# Patient Record
Sex: Female | Born: 1950
Health system: Southern US, Community
[De-identification: ages and names within clinical notes are randomized; demographics above are authoritative.]

## PROBLEM LIST (undated history)

## (undated) DIAGNOSIS — F419 Anxiety disorder, unspecified: Secondary | ICD-10-CM

## (undated) DIAGNOSIS — E079 Disorder of thyroid, unspecified: Secondary | ICD-10-CM

## (undated) HISTORY — PX: AUGMENTATION MAMMAPLASTY: SUR837

## (undated) HISTORY — DX: Disorder of thyroid, unspecified: E07.9

## (undated) HISTORY — DX: Anxiety disorder, unspecified: F41.9

---

## 1988-05-11 HISTORY — PX: UMBILICAL HERNIA REPAIR: SHX196

## 1997-05-11 HISTORY — PX: FOREARM FRACTURE SURGERY: SHX649

## 2000-09-14 ENCOUNTER — Other Ambulatory Visit: Admission: RE | Admit: 2000-09-14 | Discharge: 2000-09-14 | Payer: Self-pay | Admitting: Obstetrics and Gynecology

## 2001-05-11 HISTORY — PX: BREAST ENHANCEMENT SURGERY: SHX7

## 2001-11-24 ENCOUNTER — Other Ambulatory Visit: Admission: RE | Admit: 2001-11-24 | Discharge: 2001-11-24 | Payer: Self-pay | Admitting: Obstetrics and Gynecology

## 2001-11-30 ENCOUNTER — Encounter: Payer: Self-pay | Admitting: Obstetrics and Gynecology

## 2001-11-30 ENCOUNTER — Encounter: Admission: RE | Admit: 2001-11-30 | Discharge: 2001-11-30 | Payer: Self-pay | Admitting: Obstetrics and Gynecology

## 2002-12-22 ENCOUNTER — Other Ambulatory Visit: Admission: RE | Admit: 2002-12-22 | Discharge: 2002-12-22 | Payer: Self-pay | Admitting: Obstetrics and Gynecology

## 2004-01-30 ENCOUNTER — Other Ambulatory Visit: Admission: RE | Admit: 2004-01-30 | Discharge: 2004-01-30 | Payer: Self-pay | Admitting: Obstetrics and Gynecology

## 2004-06-11 ENCOUNTER — Encounter: Admission: RE | Admit: 2004-06-11 | Discharge: 2004-06-11 | Payer: Self-pay | Admitting: Obstetrics and Gynecology

## 2005-02-19 ENCOUNTER — Other Ambulatory Visit: Admission: RE | Admit: 2005-02-19 | Discharge: 2005-02-19 | Payer: Self-pay | Admitting: Obstetrics and Gynecology

## 2005-03-02 ENCOUNTER — Encounter: Admission: RE | Admit: 2005-03-02 | Discharge: 2005-03-02 | Payer: Self-pay | Admitting: Obstetrics and Gynecology

## 2005-11-13 ENCOUNTER — Encounter: Admission: RE | Admit: 2005-11-13 | Discharge: 2005-11-13 | Payer: Self-pay | Admitting: Obstetrics and Gynecology

## 2007-05-31 ENCOUNTER — Encounter: Admission: RE | Admit: 2007-05-31 | Discharge: 2007-05-31 | Payer: Self-pay | Admitting: Obstetrics and Gynecology

## 2008-08-02 ENCOUNTER — Encounter: Admission: RE | Admit: 2008-08-02 | Discharge: 2008-08-02 | Payer: Self-pay | Admitting: Obstetrics and Gynecology

## 2010-03-20 ENCOUNTER — Ambulatory Visit: Payer: Self-pay | Admitting: Sports Medicine

## 2010-03-20 DIAGNOSIS — IMO0002 Reserved for concepts with insufficient information to code with codable children: Secondary | ICD-10-CM | POA: Insufficient documentation
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
## 2010-07-23 ENCOUNTER — Encounter: Payer: Self-pay | Admitting: *Deleted

## 2012-02-08 ENCOUNTER — Encounter: Payer: Self-pay | Admitting: Internal Medicine

## 2012-03-14 ENCOUNTER — Ambulatory Visit (AMBULATORY_SURGERY_CENTER): Payer: 59 | Admitting: *Deleted

## 2012-03-14 VITALS — Ht 63.0 in | Wt 140.0 lb

## 2012-03-14 DIAGNOSIS — Z1211 Encounter for screening for malignant neoplasm of colon: Secondary | ICD-10-CM

## 2012-03-14 MED ORDER — NA SULFATE-K SULFATE-MG SULF 17.5-3.13-1.6 GM/177ML PO SOLN: ORAL | Status: DC

## 2012-03-28 ENCOUNTER — Encounter: Payer: Self-pay | Admitting: Internal Medicine

## 2012-03-28 ENCOUNTER — Ambulatory Visit (AMBULATORY_SURGERY_CENTER): Payer: 59 | Admitting: Internal Medicine

## 2012-03-28 VITALS — BP 107/68 | HR 62 | Temp 99.5°F | Resp 23 | Ht 63.0 in | Wt 140.0 lb

## 2012-03-28 DIAGNOSIS — Z1211 Encounter for screening for malignant neoplasm of colon: Secondary | ICD-10-CM

## 2012-03-28 MED ORDER — SODIUM CHLORIDE 0.9 % IV SOLN: 500.0000 mL | INTRAVENOUS | Status: DC

## 2012-03-28 NOTE — Patient Instructions (Addendum)
Normal colon exam today!! Repeat colonoscopy in 10 years. Resume current medications! Call us with any questions or concerns. Thank you!!  YOU HAD AN ENDOSCOPIC PROCEDURE TODAY AT THE [facility]: Refer to the procedure report that was given to you for any specific questions about what was found during the examination.  If the procedure report does not answer your questions, please call your gastroenterologist to clarify.  If you requested that your care partner not be given the details of your procedure findings, then the procedure report has been included in a sealed envelope for you to review at your convenience later.  YOU SHOULD EXPECT: Some feelings of bloating in the abdomen. Passage of more gas than usual.  Walking can help get rid of the air that was put into your GI tract during the procedure and reduce the bloating. If you had a lower endoscopy (such as a colonoscopy or flexible sigmoidoscopy) you may notice spotting of blood in your stool or on the toilet paper. If you underwent a bowel prep for your procedure, then you may not have a normal bowel movement for a few days.  DIET: Your first meal following the procedure should be a light meal and then it is ok to progress to your normal diet.  A half-sandwich or bowl of soup is an example of a good first meal.  Heavy or fried foods are harder to digest and may make you feel nauseous or bloated.  Likewise meals heavy in dairy and vegetables can cause extra gas to form and this can also increase the bloating.  Drink plenty of fluids but you should avoid alcoholic beverages for 24 hours.  ACTIVITY: Your care partner should take you home directly after the procedure.  You should plan to take it easy, moving slowly for the rest of the day.  You can resume normal activity the day after the procedure however you should NOT DRIVE or use heavy machinery for 24 hours (because of the sedation medicines used during the test).    SYMPTOMS TO  REPORT IMMEDIATELY: A gastroenterologist can be reached at any hour.  During normal business hours, 8:30 AM to 5:00 PM Monday through Friday, call [phone].  After hours and on weekends, please call the GI answering service at [phone] who will take a message and have the physician on call contact you.   Following lower endoscopy (colonoscopy or flexible sigmoidoscopy):  Excessive amounts of blood in the stool  Significant tenderness or worsening of abdominal pains  Swelling of the abdomen that is new, acute  Fever of 100F or higher  FOLLOW UP: If any biopsies were taken you will be contacted by phone or by letter within the next 1-3 weeks.  Call your gastroenterologist if you have not heard about the biopsies in 3 weeks.  Our staff will call the home number listed on your records the next business day following your procedure to check on you and address any questions or concerns that you may have at that time regarding the information given to you following your procedure. This is a courtesy call and so if there is no answer at the home number and we have not heard from you through the emergency physician on call, we will assume that you have returned to your regular daily activities without incident.  SIGNATURES/CONFIDENTIALITY: You and/or your care partner have signed paperwork which will be entered into your electronic medical record.  These signatures attest to the fact that that   the information above on your After Visit Summary has been reviewed and is understood.  Full responsibility of the confidentiality of this discharge information lies with you and/or your care-partner.  

## 2012-03-28 NOTE — Progress Notes (Signed)
Patient did not experience any of the following events: a burn prior to discharge; a fall within the facility; wrong site/side/patient/procedure/implant event; or a hospital transfer or hospital admission upon discharge from the facility. (G8907) Patient did not have preoperative order for IV antibiotic SSI prophylaxis. (G8918)  

## 2012-03-28 NOTE — Progress Notes (Signed)
Pt stable, drowsy, vss, report to [place], Charity fundraiser

## 2012-03-28 NOTE — Op Note (Signed)
 [facility] [address] [place], [address]   COLONOSCOPY PROCEDURE REPORT  PATIENT: Renee, Montgomery  MR#: [id number] BIRTHDATE: [birth year] , 61  yrs. old GENDER: Female ENDOSCOPIST: Iva Boop, MD, [facility] REFERRED GM:WNUUV Docia Chuck, M.D. PROCEDURE DATE:  03/28/2012 PROCEDURE:   Colonoscopy, diagnostic ASA CLASS:   Class II INDICATIONS:average risk screening. MEDICATIONS: propofol (Diprivan) 300mg  IV, MAC sedation, administered by CRNA, and These medications were titrated to patient response per physician's verbal order  DESCRIPTION OF PROCEDURE:   After the risks benefits and alternatives of the procedure were thoroughly explained, informed consent was obtained.  A digital rectal exam revealed no abnormalities of the rectum.   The LB [id number]-[id number]  endoscope was introduced through the anus and advanced to the cecum, which was identified by both the appendix and ileocecal valve. No adverse events experienced.   The quality of the prep was Suprep excellent The instrument was then slowly withdrawn as the colon was fully examined.      COLON FINDINGS: A normal appearing cecum, ileocecal valve, and appendiceal orifice were identified.  The ascending, hepatic flexure, transverse, splenic flexure, descending, sigmoid colon and rectum appeared unremarkable.  No polyps or cancers were seen. Retroflexed views revealed no abnormalities. The time to cecum=7 minutes 53 seconds.  Withdrawal time=9 minutes 35 seconds.  The scope was withdrawn and the procedure completed. COMPLICATIONS: There were no complications.  ENDOSCOPIC IMPRESSION: Normal colon  RECOMMENDATIONS: Repeat Colonscopy in 10 years.   eSigned:  Iva Boop, MD, [facility] 03/28/2012 10:22 AM   cc: The Patient  and Dibias Docia Chuck, MD

## 2012-03-29 ENCOUNTER — Telehealth: Payer: Self-pay | Admitting: *Deleted

## 2012-03-29 NOTE — Telephone Encounter (Signed)
  Follow up Call-  Call back number 03/28/2012  Post procedure Call Back phone  # [phone]  office  Permission to leave phone message Yes     Patient questions:  Unable to leave a message on VM as pt doesn't have one.  Uncomfortable leaving personal message with staff.

## 2012-10-04 ENCOUNTER — Other Ambulatory Visit: Payer: Self-pay | Admitting: Obstetrics and Gynecology

## 2012-10-04 DIAGNOSIS — R928 Other abnormal and inconclusive findings on diagnostic imaging of breast: Secondary | ICD-10-CM

## 2012-10-13 ENCOUNTER — Ambulatory Visit
Admission: RE | Admit: 2012-10-13 | Discharge: 2012-10-13 | Disposition: A | Discharge status: Home / Self Care | Payer: 59 | Source: Ambulatory Visit | Attending: Obstetrics and Gynecology | Admitting: Obstetrics and Gynecology

## 2012-10-13 DIAGNOSIS — R928 Other abnormal and inconclusive findings on diagnostic imaging of breast: Secondary | ICD-10-CM

## 2014-11-01 ENCOUNTER — Other Ambulatory Visit: Payer: Self-pay | Admitting: Obstetrics and Gynecology

## 2014-11-01 DIAGNOSIS — R928 Other abnormal and inconclusive findings on diagnostic imaging of breast: Secondary | ICD-10-CM

## 2014-11-05 ENCOUNTER — Ambulatory Visit
Admission: RE | Admit: 2014-11-05 | Discharge: 2014-11-05 | Disposition: A | Discharge status: Home / Self Care | Payer: 59 | Source: Ambulatory Visit | Attending: Obstetrics and Gynecology | Admitting: Obstetrics and Gynecology

## 2014-11-05 DIAGNOSIS — R928 Other abnormal and inconclusive findings on diagnostic imaging of breast: Secondary | ICD-10-CM

## 2015-04-17 ENCOUNTER — Other Ambulatory Visit: Payer: Self-pay | Admitting: Obstetrics and Gynecology

## 2015-04-17 DIAGNOSIS — R921 Mammographic calcification found on diagnostic imaging of breast: Secondary | ICD-10-CM

## 2015-05-16 ENCOUNTER — Ambulatory Visit
Admission: RE | Admit: 2015-05-16 | Discharge: 2015-05-16 | Disposition: A | Discharge status: Home / Self Care | Payer: 59 | Source: Ambulatory Visit | Attending: Obstetrics and Gynecology | Admitting: Obstetrics and Gynecology

## 2015-05-16 DIAGNOSIS — R921 Mammographic calcification found on diagnostic imaging of breast: Secondary | ICD-10-CM | POA: Diagnosis not present

## 2015-06-28 MED FILL — ZOLPIDEM TARTRATE 10 MG TAB: 10 | 90 days supply | Qty: 90 | Fill #0

## 2015-07-05 MED FILL — ESTRADIOL-NORETH 1.0-0.5 MG: 1-0.5 | 84 days supply | Qty: 84 | Fill #3

## 2015-07-05 MED FILL — LEVOTHYROXINE 100 MCG TAB: 100 | 90 days supply | Qty: 90 | Fill #1

## 2015-07-05 MED FILL — ALPRAZolam 0.25 MG TABS: 0.25 | 30 days supply | Qty: 30 | Fill #0

## 2015-08-15 MED FILL — ESTRACE 0.01% CREAM: 0.1 | 30 days supply | Qty: 43 | Fill #1

## 2015-10-11 MED FILL — LEVOTHYROXINE 100 MCG TAB: 100 | 90 days supply | Qty: 90 | Fill #2

## 2015-10-11 MED FILL — ESTRADIOL-NORETH 1.0-0.5 MG: 1-0.5 | 28 days supply | Qty: 28 | Fill #4

## 2015-11-01 ENCOUNTER — Other Ambulatory Visit: Payer: Self-pay | Admitting: Obstetrics and Gynecology

## 2015-11-01 DIAGNOSIS — R921 Mammographic calcification found on diagnostic imaging of breast: Secondary | ICD-10-CM

## 2015-11-04 DIAGNOSIS — Z01419 Encounter for gynecological examination (general) (routine) without abnormal findings: Secondary | ICD-10-CM | POA: Diagnosis not present

## 2015-11-04 DIAGNOSIS — Z6824 Body mass index (BMI) 24.0-24.9, adult: Secondary | ICD-10-CM | POA: Diagnosis not present

## 2015-11-06 ENCOUNTER — Other Ambulatory Visit: Payer: Self-pay | Admitting: Obstetrics and Gynecology

## 2015-11-06 DIAGNOSIS — Z01419 Encounter for gynecological examination (general) (routine) without abnormal findings: Secondary | ICD-10-CM

## 2015-11-07 ENCOUNTER — Other Ambulatory Visit: Payer: Self-pay | Admitting: Obstetrics and Gynecology

## 2015-11-07 DIAGNOSIS — E2839 Other primary ovarian failure: Secondary | ICD-10-CM

## 2015-11-11 MED FILL — ZOLPIDEM TARTRATE 10 MG TAB: 10 | 90 days supply | Qty: 90 | Fill #1

## 2015-11-13 MED FILL — ESTRADIOL-NORETH 1.0-0.5 MG: 1-0.5 | 28 days supply | Qty: 28 | Fill #0

## 2015-11-14 ENCOUNTER — Other Ambulatory Visit: Payer: Self-pay | Admitting: Obstetrics and Gynecology

## 2015-11-14 ENCOUNTER — Ambulatory Visit
Admission: RE | Admit: 2015-11-14 | Discharge: 2015-11-14 | Disposition: A | Discharge status: Home / Self Care | Payer: 59 | Source: Ambulatory Visit | Attending: Obstetrics and Gynecology | Admitting: Obstetrics and Gynecology

## 2015-11-14 DIAGNOSIS — R921 Mammographic calcification found on diagnostic imaging of breast: Secondary | ICD-10-CM

## 2015-11-14 MED FILL — ALPRAZolam 0.25 MG TABS: 0.25 | 30 days supply | Qty: 30 | Fill #0

## 2015-12-16 MED FILL — ESTRADIOL-NORETH 1.0-0.5 MG: 1-0.5 | 84 days supply | Qty: 84 | Fill #1

## 2016-01-02 ENCOUNTER — Ambulatory Visit
Admission: RE | Admit: 2016-01-02 | Discharge: 2016-01-02 | Disposition: A | Discharge status: Home / Self Care | Payer: 59 | Source: Ambulatory Visit | Attending: Obstetrics and Gynecology | Admitting: Obstetrics and Gynecology

## 2016-01-02 DIAGNOSIS — M85851 Other specified disorders of bone density and structure, right thigh: Secondary | ICD-10-CM | POA: Diagnosis not present

## 2016-01-02 DIAGNOSIS — E2839 Other primary ovarian failure: Secondary | ICD-10-CM

## 2016-01-02 DIAGNOSIS — Z780 Asymptomatic menopausal state: Secondary | ICD-10-CM | POA: Diagnosis not present

## 2016-01-06 MED FILL — LEVOTHYROXINE 100 MCG TAB: 100 | 90 days supply | Qty: 90 | Fill #3

## 2016-03-10 MED FILL — ESTRADIOL-NORETH 1.0-0.5 MG: 1-0.5 | 84 days supply | Qty: 84 | Fill #2

## 2016-04-06 DIAGNOSIS — R5382 Chronic fatigue, unspecified: Secondary | ICD-10-CM | POA: Diagnosis not present

## 2016-04-06 DIAGNOSIS — E039 Hypothyroidism, unspecified: Secondary | ICD-10-CM | POA: Diagnosis not present

## 2016-04-06 DIAGNOSIS — G4700 Insomnia, unspecified: Secondary | ICD-10-CM | POA: Diagnosis not present

## 2016-04-06 DIAGNOSIS — F419 Anxiety disorder, unspecified: Secondary | ICD-10-CM | POA: Diagnosis not present

## 2016-04-06 DIAGNOSIS — Z13220 Encounter for screening for lipoid disorders: Secondary | ICD-10-CM | POA: Diagnosis not present

## 2016-04-06 DIAGNOSIS — Z0000 Encounter for general adult medical examination without abnormal findings: Secondary | ICD-10-CM | POA: Diagnosis not present

## 2016-04-06 MED FILL — ALPRAZolam 0.25 MG TABS: 0.25 | 30 days supply | Qty: 30 | Fill #0

## 2016-04-06 MED FILL — LEVOTHYROXINE 100 MCG TAB: 100 | 90 days supply | Qty: 90 | Fill #0

## 2016-04-06 MED FILL — ZOLPIDEM TARTRATE 10 MG TAB: 10 | 90 days supply | Qty: 90 | Fill #0

## 2016-05-08 MED FILL — ALPRAZolam 0.25 MG TABS: 0.25 | 30 days supply | Qty: 30 | Fill #1

## 2016-05-22 DIAGNOSIS — D1801 Hemangioma of skin and subcutaneous tissue: Secondary | ICD-10-CM | POA: Diagnosis not present

## 2016-05-22 DIAGNOSIS — L570 Actinic keratosis: Secondary | ICD-10-CM | POA: Diagnosis not present

## 2016-05-22 DIAGNOSIS — D171 Benign lipomatous neoplasm of skin and subcutaneous tissue of trunk: Secondary | ICD-10-CM | POA: Diagnosis not present

## 2016-05-22 DIAGNOSIS — L821 Other seborrheic keratosis: Secondary | ICD-10-CM | POA: Diagnosis not present

## 2016-05-28 MED FILL — ESTRADIOL-NORETH 1.0-0.5 MG: 1-0.5 | 28 days supply | Qty: 28 | Fill #3

## 2016-06-25 MED FILL — ESTRADIOL-NORETH 1.0-0.5 MG: 1-0.5 | 28 days supply | Qty: 28 | Fill #4

## 2016-07-01 DIAGNOSIS — H524 Presbyopia: Secondary | ICD-10-CM | POA: Diagnosis not present

## 2016-07-06 MED FILL — LEVOTHYROXINE 100 MCG TABLE: 100 | 90 days supply | Qty: 90 | Fill #1

## 2016-07-27 MED FILL — ESTRADIOL-NORETH 1.0-0.5 MG: 1-0.5 | 28 days supply | Qty: 28 | Fill #5

## 2016-08-11 DIAGNOSIS — E039 Hypothyroidism, unspecified: Secondary | ICD-10-CM | POA: Diagnosis not present

## 2016-08-21 MED FILL — ZOLPIDEM TARTRATE 10 MG TAB: 10 | 90 days supply | Qty: 90 | Fill #1

## 2016-08-21 MED FILL — ESTRADIOL-NORETH 1.0-0.5 MG: 1-0.5 | 28 days supply | Qty: 28 | Fill #6

## 2016-08-26 ENCOUNTER — Other Ambulatory Visit: Payer: Self-pay | Admitting: Family Medicine

## 2016-08-26 ENCOUNTER — Ambulatory Visit
Admission: RE | Admit: 2016-08-26 | Discharge: 2016-08-26 | Disposition: A | Discharge status: Home / Self Care | Payer: 59 | Source: Ambulatory Visit | Attending: Family Medicine | Admitting: Family Medicine

## 2016-08-26 DIAGNOSIS — M79672 Pain in left foot: Secondary | ICD-10-CM | POA: Diagnosis not present

## 2016-08-26 DIAGNOSIS — M79675 Pain in left toe(s): Secondary | ICD-10-CM | POA: Diagnosis not present

## 2016-08-26 MED FILL — INDOMETHACIN 50 MG CAPSULE: 50 | 15 days supply | Qty: 30 | Fill #0

## 2016-08-27 DIAGNOSIS — M9903 Segmental and somatic dysfunction of lumbar region: Secondary | ICD-10-CM | POA: Diagnosis not present

## 2016-08-27 DIAGNOSIS — M5416 Radiculopathy, lumbar region: Secondary | ICD-10-CM | POA: Diagnosis not present

## 2016-08-27 DIAGNOSIS — M5136 Other intervertebral disc degeneration, lumbar region: Secondary | ICD-10-CM | POA: Diagnosis not present

## 2016-08-27 DIAGNOSIS — M25572 Pain in left ankle and joints of left foot: Secondary | ICD-10-CM | POA: Diagnosis not present

## 2016-08-27 MED FILL — DOXYCYCLINE HYC 100 MG CAP: 100 | 7 days supply | Qty: 14 | Fill #0

## 2016-09-17 MED FILL — ESTRADIOL-NORETH 1.0-0.5 MG: 1-0.5 | 28 days supply | Qty: 28 | Fill #7

## 2016-10-06 MED FILL — LEVOTHYROXINE 100 MCG TABLE: 100 | 90 days supply | Qty: 90 | Fill #2

## 2016-10-22 MED FILL — ESTRADIOL-NORETH 1.0-0.5 MG: 1-0.5 | 28 days supply | Qty: 28 | Fill #8

## 2016-11-10 DIAGNOSIS — Z124 Encounter for screening for malignant neoplasm of cervix: Secondary | ICD-10-CM | POA: Diagnosis not present

## 2016-11-10 DIAGNOSIS — Z01419 Encounter for gynecological examination (general) (routine) without abnormal findings: Secondary | ICD-10-CM | POA: Diagnosis not present

## 2016-11-10 DIAGNOSIS — R87610 Atypical squamous cells of undetermined significance on cytologic smear of cervix (ASC-US): Secondary | ICD-10-CM | POA: Diagnosis not present

## 2016-11-13 ENCOUNTER — Other Ambulatory Visit: Payer: Self-pay | Admitting: Obstetrics and Gynecology

## 2016-11-13 DIAGNOSIS — R921 Mammographic calcification found on diagnostic imaging of breast: Secondary | ICD-10-CM

## 2016-11-13 MED FILL — ESTRADIOL-NORETH 1.0-0.5 MG: 1-0.5 | 28 days supply | Qty: 28 | Fill #0

## 2016-11-20 ENCOUNTER — Ambulatory Visit
Admission: RE | Admit: 2016-11-20 | Discharge: 2016-11-20 | Disposition: A | Discharge status: Home / Self Care | Payer: 59 | Source: Ambulatory Visit | Attending: Obstetrics and Gynecology | Admitting: Obstetrics and Gynecology

## 2016-11-20 DIAGNOSIS — R921 Mammographic calcification found on diagnostic imaging of breast: Secondary | ICD-10-CM

## 2016-12-24 MED FILL — LEVOTHYROXINE 100 MCG TABLE: 100 | 90 days supply | Qty: 90 | Fill #3

## 2016-12-24 MED FILL — ZOLPIDEM TARTRATE 10 MG TAB: 10 | 90 days supply | Qty: 90 | Fill #0 | Status: TO

## 2016-12-24 MED FILL — ALPRAZolam 0.25 MG TABS: 0.25 | 30 days supply | Qty: 30 | Fill #0

## 2016-12-24 MED FILL — ESTRADIOL-NORETH 1.0-0.5 MG: 1-0.5 | 84 days supply | Qty: 84 | Fill #1

## 2017-01-07 DIAGNOSIS — M545 Low back pain: Secondary | ICD-10-CM | POA: Diagnosis not present

## 2017-01-07 DIAGNOSIS — M546 Pain in thoracic spine: Secondary | ICD-10-CM | POA: Diagnosis not present

## 2017-01-07 DIAGNOSIS — M25552 Pain in left hip: Secondary | ICD-10-CM | POA: Diagnosis not present

## 2017-01-07 DIAGNOSIS — M542 Cervicalgia: Secondary | ICD-10-CM | POA: Diagnosis not present

## 2017-03-03 IMAGING — MG 2D DIGITAL DIAGNOSTIC BILATERAL MAMMOGRAM WITH IMPLANTS, CAD AND
8 of 18 series · 8 of 34 positions shown · non-contrast
Comparison: Previous exams including diagnostic mammograms of
05/16/2015 and 11/05/2014

CLINICAL DATA: Short-term follow-up for probably benign right
breast calcifications.

EXAM:
2D DIGITAL DIAGNOSTIC BILATERAL MAMMOGRAM WITH IMPLANTS, CAD AND
ADJUNCT TOMO
The patient has retropectoral saline implants. Standard and implant
displaced views were performed.

[R MLO]
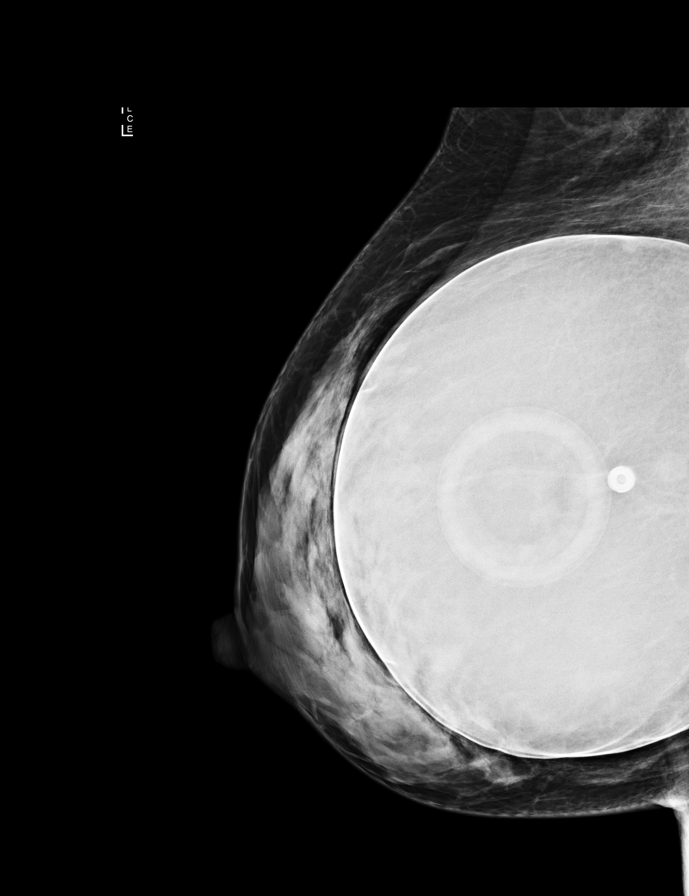

[R CC (1 of 2)]
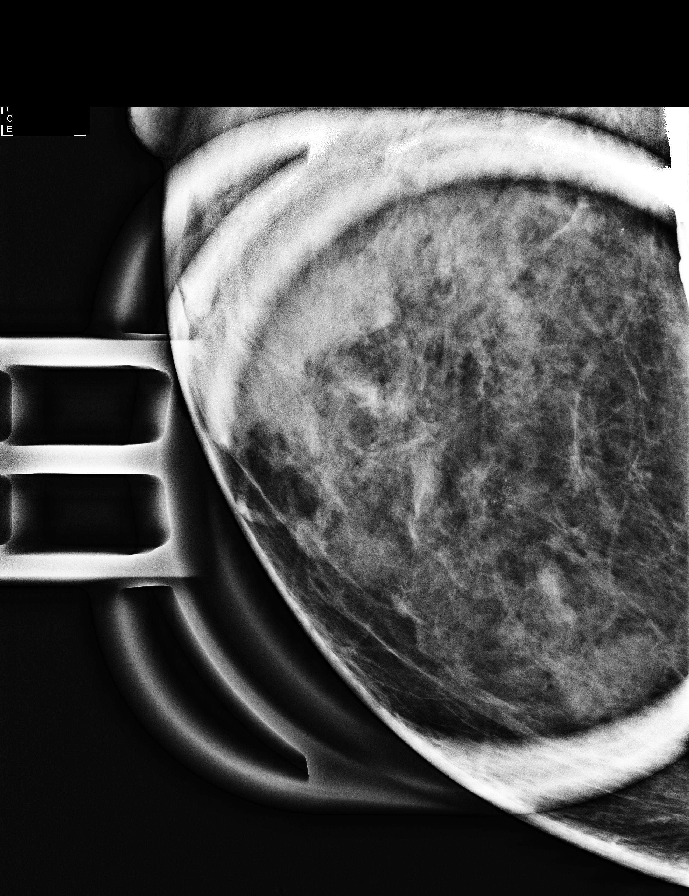

[L CC]
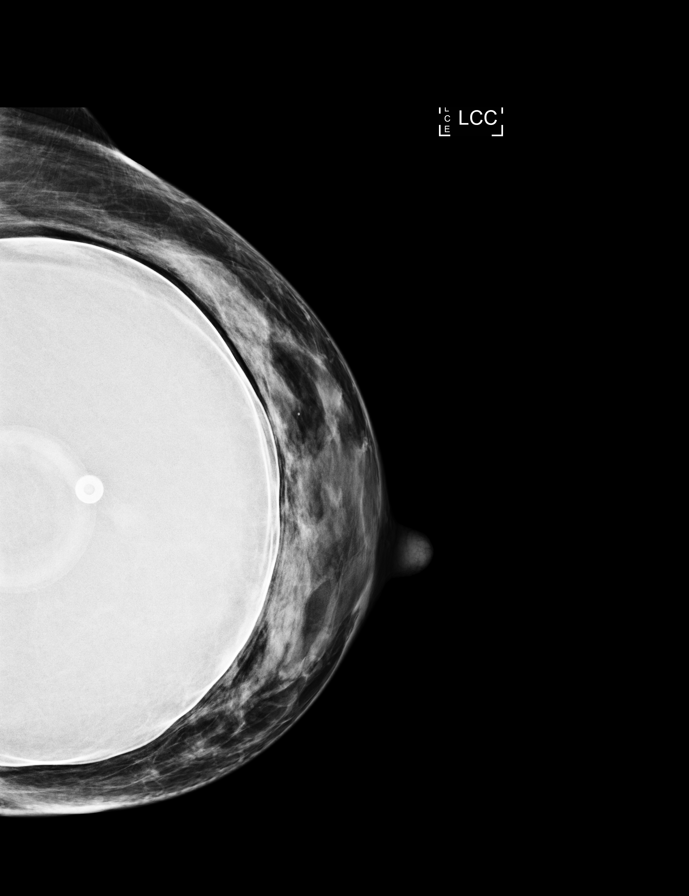

[L MLO]
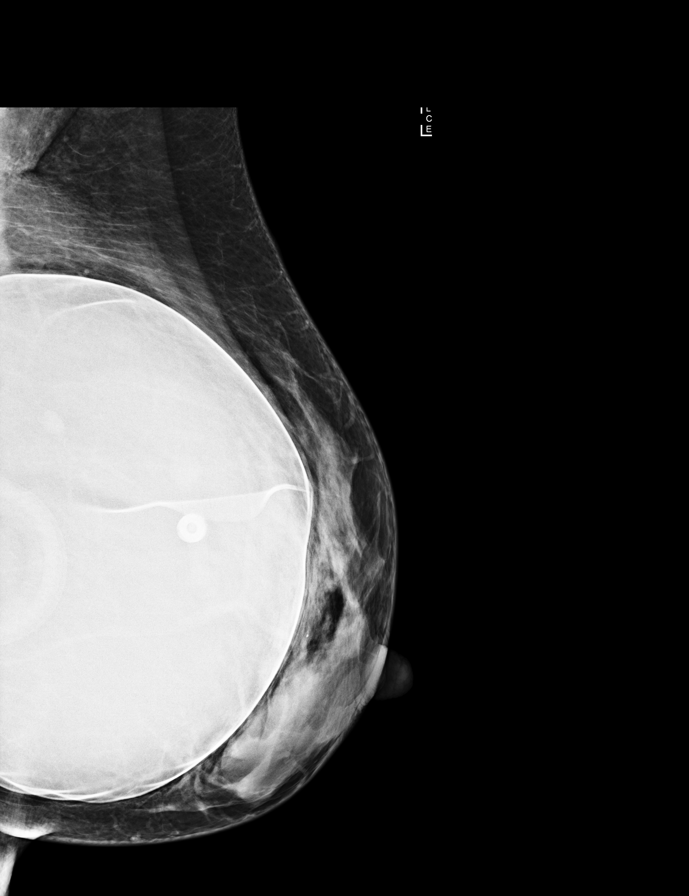

[R ML]
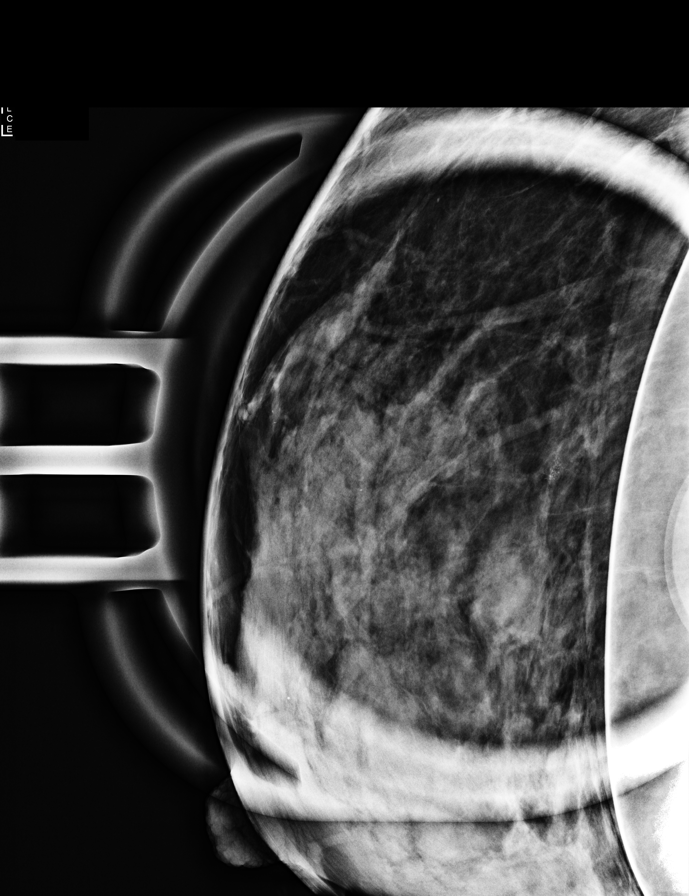

[R CC (2 of 2)]
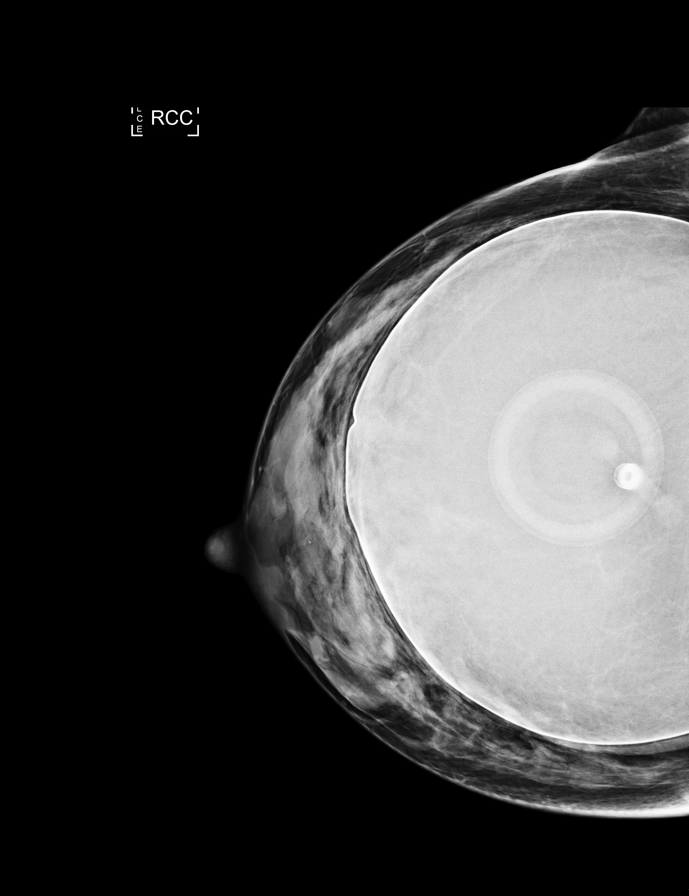

[L MLO synth-2D]
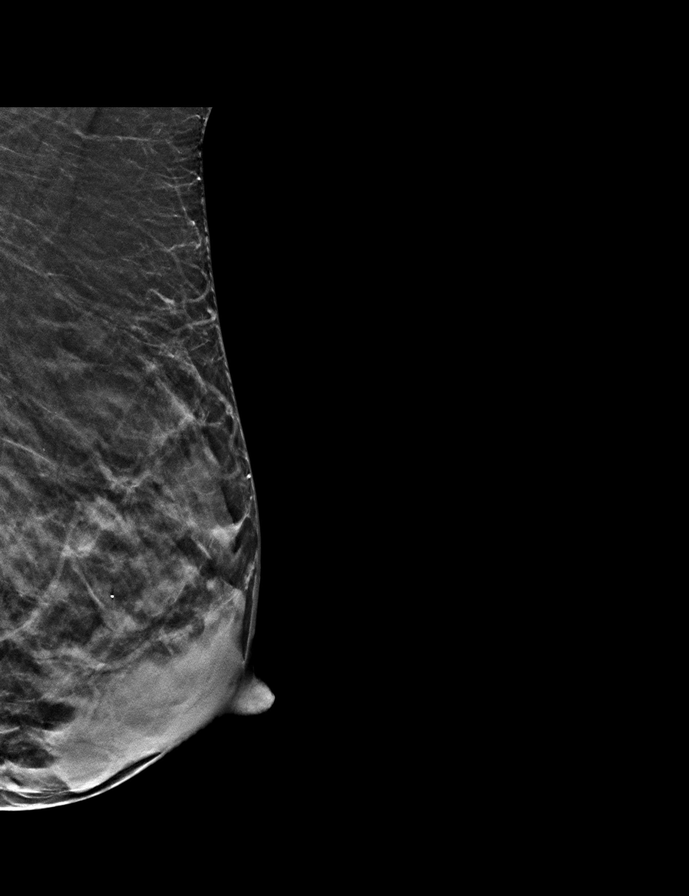

[R MLO synth-2D]
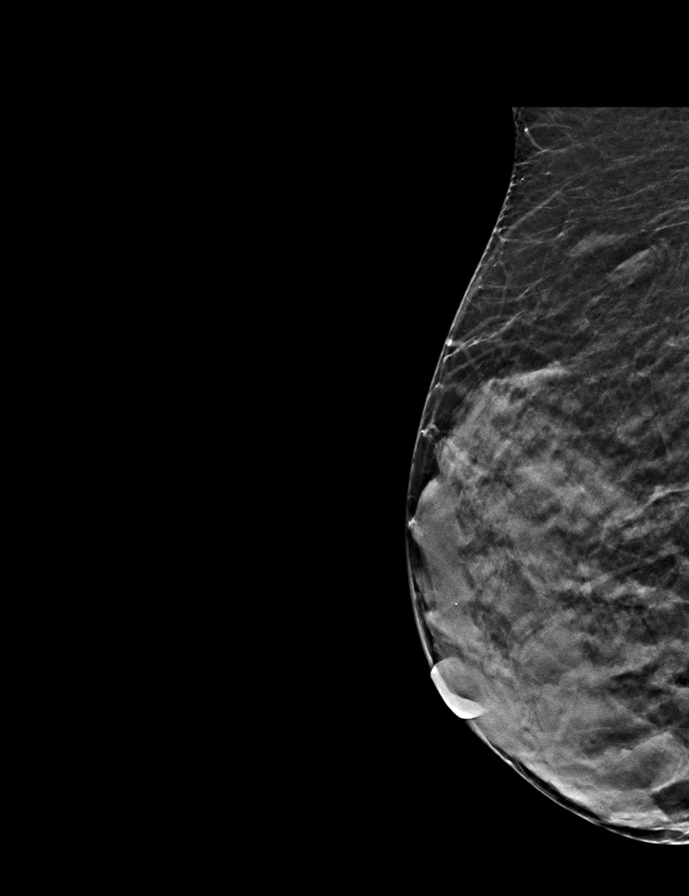

[8 of 34 positions shown; findings below may reference images not displayed]

ACR Breast Density Category c: The breast tissue is heterogeneously
dense, which may obscure small masses.
FINDINGS: The grouped calcifications within the upper inner quadrant of the
right breast are stable in extent. There are no new calcifications
in the right breast. No new dominant masses or secondary signs of
malignancy are identified in the right breast.

There are no new dominant masses, suspicious calcifications or
secondary signs of malignancy identified within the left breast.

Bilateral saline implants appear stable in position and
configuration.

Mammographic images were processed with CAD.
IMPRESSION: Stable probably benign calcifications within the upper inner
quadrant of the right breast.

Recommend additional follow-up diagnostic mammogram in 12 months to
ensure 2 year stability.

RECOMMENDATION:
Bilateral diagnostic mammogram, with right breast magnification
views, in 12 months.

I have discussed the findings and recommendations with the patient.
Results were also provided in writing at the conclusion of the
visit. If applicable, a reminder letter will be sent to the patient
regarding the next appointment.

BI-RADS CATEGORY  3: Probably benign.

## 2017-04-05 MED FILL — LEVOTHYROXINE 100 MCG TABLE: 100 | 90 days supply | Qty: 90 | Fill #0

## 2017-04-05 MED FILL — ESTRADIOL-NORETH 1.0-0.5 MG: 1-0.5 | 28 days supply | Qty: 28 | Fill #2

## 2017-04-09 DIAGNOSIS — L820 Inflamed seborrheic keratosis: Secondary | ICD-10-CM | POA: Diagnosis not present

## 2017-04-09 DIAGNOSIS — D225 Melanocytic nevi of trunk: Secondary | ICD-10-CM | POA: Diagnosis not present

## 2017-04-09 DIAGNOSIS — Z85828 Personal history of other malignant neoplasm of skin: Secondary | ICD-10-CM | POA: Diagnosis not present

## 2017-04-09 DIAGNOSIS — D485 Neoplasm of uncertain behavior of skin: Secondary | ICD-10-CM | POA: Diagnosis not present

## 2017-04-29 MED FILL — ESTRADIOL-NORETH 1.0-0.5 MG: 1-0.5 | 28 days supply | Qty: 28 | Fill #3

## 2017-05-19 DIAGNOSIS — E039 Hypothyroidism, unspecified: Secondary | ICD-10-CM | POA: Diagnosis not present

## 2017-05-19 DIAGNOSIS — G4700 Insomnia, unspecified: Secondary | ICD-10-CM | POA: Diagnosis not present

## 2017-05-19 DIAGNOSIS — Z79899 Other long term (current) drug therapy: Secondary | ICD-10-CM | POA: Diagnosis not present

## 2017-05-19 DIAGNOSIS — F419 Anxiety disorder, unspecified: Secondary | ICD-10-CM | POA: Diagnosis not present

## 2017-05-19 DIAGNOSIS — Z0000 Encounter for general adult medical examination without abnormal findings: Secondary | ICD-10-CM | POA: Diagnosis not present

## 2017-05-31 DIAGNOSIS — H6691 Otitis media, unspecified, right ear: Secondary | ICD-10-CM | POA: Diagnosis not present

## 2017-05-31 MED FILL — ESTRADIOL-NORETH 1.0-0.5 MG: 1-0.5 | 28 days supply | Qty: 28 | Fill #4 | Status: TO

## 2017-05-31 MED FILL — AMOX-CLAV 875-125 MG TABLET: 875-125 | 7 days supply | Qty: 14 | Fill #0

## 2017-06-07 DIAGNOSIS — H9201 Otalgia, right ear: Secondary | ICD-10-CM | POA: Diagnosis not present

## 2017-06-07 DIAGNOSIS — H9191 Unspecified hearing loss, right ear: Secondary | ICD-10-CM | POA: Diagnosis not present

## 2017-06-07 MED FILL — predniSONE 5 MG TABS: 5 | 6 days supply | Qty: 42 | Fill #0

## 2017-06-10 DIAGNOSIS — M5031 Other cervical disc degeneration,  high cervical region: Secondary | ICD-10-CM | POA: Diagnosis not present

## 2017-06-10 DIAGNOSIS — M9901 Segmental and somatic dysfunction of cervical region: Secondary | ICD-10-CM | POA: Diagnosis not present

## 2017-06-11 DIAGNOSIS — M9901 Segmental and somatic dysfunction of cervical region: Secondary | ICD-10-CM | POA: Diagnosis not present

## 2017-06-11 DIAGNOSIS — M5031 Other cervical disc degeneration,  high cervical region: Secondary | ICD-10-CM | POA: Diagnosis not present

## 2017-06-14 DIAGNOSIS — M9901 Segmental and somatic dysfunction of cervical region: Secondary | ICD-10-CM | POA: Diagnosis not present

## 2017-06-14 DIAGNOSIS — M5031 Other cervical disc degeneration,  high cervical region: Secondary | ICD-10-CM | POA: Diagnosis not present

## 2017-06-16 DIAGNOSIS — M9901 Segmental and somatic dysfunction of cervical region: Secondary | ICD-10-CM | POA: Diagnosis not present

## 2017-06-16 DIAGNOSIS — M5031 Other cervical disc degeneration,  high cervical region: Secondary | ICD-10-CM | POA: Diagnosis not present

## 2017-06-21 DIAGNOSIS — H9192 Unspecified hearing loss, left ear: Secondary | ICD-10-CM | POA: Diagnosis not present

## 2017-06-21 DIAGNOSIS — H9201 Otalgia, right ear: Secondary | ICD-10-CM | POA: Diagnosis not present

## 2017-06-21 DIAGNOSIS — H9311 Tinnitus, right ear: Secondary | ICD-10-CM | POA: Diagnosis not present

## 2017-06-21 DIAGNOSIS — H9041 Sensorineural hearing loss, unilateral, right ear, with unrestricted hearing on the contralateral side: Secondary | ICD-10-CM | POA: Diagnosis not present

## 2017-06-25 MED FILL — LEVOTHYROXINE 100 MCG TABLE: 100 | 90 days supply | Qty: 90 | Fill #0

## 2017-06-25 MED FILL — ESTRADIOL-NORETH 1.0-0.5 MG: 1-0.5 | 28 days supply | Qty: 28 | Fill #0

## 2017-07-13 DIAGNOSIS — H9041 Sensorineural hearing loss, unilateral, right ear, with unrestricted hearing on the contralateral side: Secondary | ICD-10-CM | POA: Diagnosis not present

## 2017-07-13 DIAGNOSIS — H903 Sensorineural hearing loss, bilateral: Secondary | ICD-10-CM | POA: Diagnosis not present

## 2017-07-13 DIAGNOSIS — H9201 Otalgia, right ear: Secondary | ICD-10-CM | POA: Diagnosis not present

## 2017-07-13 DIAGNOSIS — H9311 Tinnitus, right ear: Secondary | ICD-10-CM | POA: Diagnosis not present

## 2017-07-19 MED FILL — ESTRADIOL-NORETH 1.0-0.5 MG: 1-0.5 | 28 days supply | Qty: 28 | Fill #1

## 2017-07-19 MED FILL — ALPRAZolam 0.25 MG TABS: 0.25 | 30 days supply | Qty: 30 | Fill #0

## 2017-07-23 MED FILL — ZOLPIDEM TARTRATE 10 MG TAB: 10 | 30 days supply | Qty: 30 | Fill #0

## 2017-08-17 MED FILL — ESTRADIOL-NORETH 1.0-0.5 MG: 1-0.5 | 28 days supply | Qty: 28 | Fill #2

## 2017-09-15 MED FILL — LEVOTHYROXINE 100 MCG TABLE: 100 | 90 days supply | Qty: 90 | Fill #1

## 2017-09-15 MED FILL — ESTRADIOL-NORETH 1.0-0.5 MG: 1-0.5 | 28 days supply | Qty: 28 | Fill #3

## 2017-10-13 MED FILL — ESTRADIOL-NORETH 1.0-0.5 MG: 1-0.5 | 28 days supply | Qty: 28 | Fill #4

## 2017-11-15 MED FILL — ESTRADIOL-NORETH 1.0-0.5 MG: 1-0.5 | 28 days supply | Qty: 28 | Fill #0

## 2017-11-26 DIAGNOSIS — Z124 Encounter for screening for malignant neoplasm of cervix: Secondary | ICD-10-CM | POA: Diagnosis not present

## 2017-11-26 DIAGNOSIS — E039 Hypothyroidism, unspecified: Secondary | ICD-10-CM | POA: Diagnosis not present

## 2017-11-26 DIAGNOSIS — Z01419 Encounter for gynecological examination (general) (routine) without abnormal findings: Secondary | ICD-10-CM | POA: Diagnosis not present

## 2017-11-26 DIAGNOSIS — R87610 Atypical squamous cells of undetermined significance on cytologic smear of cervix (ASC-US): Secondary | ICD-10-CM | POA: Diagnosis not present

## 2017-11-30 ENCOUNTER — Other Ambulatory Visit: Payer: Self-pay | Admitting: Obstetrics and Gynecology

## 2017-11-30 DIAGNOSIS — R5381 Other malaise: Secondary | ICD-10-CM

## 2017-12-01 ENCOUNTER — Other Ambulatory Visit: Payer: Self-pay | Admitting: Obstetrics and Gynecology

## 2017-12-01 DIAGNOSIS — E2839 Other primary ovarian failure: Secondary | ICD-10-CM

## 2017-12-08 MED FILL — ESTRADIOL-NORETH 1.0-0.5 MG: 1-0.5 | 28 days supply | Qty: 28 | Fill #0

## 2017-12-16 DIAGNOSIS — M5031 Other cervical disc degeneration,  high cervical region: Secondary | ICD-10-CM | POA: Diagnosis not present

## 2017-12-16 DIAGNOSIS — M9901 Segmental and somatic dysfunction of cervical region: Secondary | ICD-10-CM | POA: Diagnosis not present

## 2017-12-17 DIAGNOSIS — Z1231 Encounter for screening mammogram for malignant neoplasm of breast: Secondary | ICD-10-CM | POA: Diagnosis not present

## 2017-12-22 DIAGNOSIS — M5031 Other cervical disc degeneration,  high cervical region: Secondary | ICD-10-CM | POA: Diagnosis not present

## 2017-12-22 DIAGNOSIS — M9901 Segmental and somatic dysfunction of cervical region: Secondary | ICD-10-CM | POA: Diagnosis not present

## 2017-12-30 DIAGNOSIS — M9901 Segmental and somatic dysfunction of cervical region: Secondary | ICD-10-CM | POA: Diagnosis not present

## 2017-12-30 DIAGNOSIS — M5031 Other cervical disc degeneration,  high cervical region: Secondary | ICD-10-CM | POA: Diagnosis not present

## 2018-01-04 DIAGNOSIS — M5031 Other cervical disc degeneration,  high cervical region: Secondary | ICD-10-CM | POA: Diagnosis not present

## 2018-01-04 DIAGNOSIS — M9901 Segmental and somatic dysfunction of cervical region: Secondary | ICD-10-CM | POA: Diagnosis not present

## 2018-01-04 MED FILL — ESTRADIOL-NORETH 1.0-0.5 MG: 1-0.5 | 28 days supply | Qty: 28 | Fill #0

## 2018-01-04 MED FILL — LEVOTHYROXINE 100 MCG TABLE: 100 | 90 days supply | Qty: 90 | Fill #2

## 2018-01-06 DIAGNOSIS — M9901 Segmental and somatic dysfunction of cervical region: Secondary | ICD-10-CM | POA: Diagnosis not present

## 2018-01-06 DIAGNOSIS — M5031 Other cervical disc degeneration,  high cervical region: Secondary | ICD-10-CM | POA: Diagnosis not present

## 2018-01-11 DIAGNOSIS — M5031 Other cervical disc degeneration,  high cervical region: Secondary | ICD-10-CM | POA: Diagnosis not present

## 2018-01-11 DIAGNOSIS — M9901 Segmental and somatic dysfunction of cervical region: Secondary | ICD-10-CM | POA: Diagnosis not present

## 2018-01-13 DIAGNOSIS — M5031 Other cervical disc degeneration,  high cervical region: Secondary | ICD-10-CM | POA: Diagnosis not present

## 2018-01-13 DIAGNOSIS — M9901 Segmental and somatic dysfunction of cervical region: Secondary | ICD-10-CM | POA: Diagnosis not present

## 2018-01-18 ENCOUNTER — Ambulatory Visit
Admission: RE | Admit: 2018-01-18 | Discharge: 2018-01-18 | Disposition: A | Discharge status: Home / Self Care | Payer: PPO | Source: Ambulatory Visit | Attending: Obstetrics and Gynecology | Admitting: Obstetrics and Gynecology

## 2018-01-18 DIAGNOSIS — M8589 Other specified disorders of bone density and structure, multiple sites: Secondary | ICD-10-CM | POA: Diagnosis not present

## 2018-01-18 DIAGNOSIS — Z780 Asymptomatic menopausal state: Secondary | ICD-10-CM | POA: Diagnosis not present

## 2018-01-18 DIAGNOSIS — E2839 Other primary ovarian failure: Secondary | ICD-10-CM

## 2018-02-04 MED FILL — ESTRADIOL-NORETH 1.0-0.5 MG: 1-0.5 | 28 days supply | Qty: 28 | Fill #1

## 2018-02-11 DIAGNOSIS — M9901 Segmental and somatic dysfunction of cervical region: Secondary | ICD-10-CM | POA: Diagnosis not present

## 2018-02-11 DIAGNOSIS — M5031 Other cervical disc degeneration,  high cervical region: Secondary | ICD-10-CM | POA: Diagnosis not present

## 2018-02-22 DIAGNOSIS — M5031 Other cervical disc degeneration,  high cervical region: Secondary | ICD-10-CM | POA: Diagnosis not present

## 2018-02-22 DIAGNOSIS — M9901 Segmental and somatic dysfunction of cervical region: Secondary | ICD-10-CM | POA: Diagnosis not present

## 2018-03-02 MED FILL — ALPRAZolam 0.25 MG TABS: 0.25 | 30 days supply | Qty: 30 | Fill #0

## 2018-03-02 MED FILL — ZOLPIDEM TARTRATE 10 MG TAB: 10 | 90 days supply | Qty: 90 | Fill #0

## 2018-03-02 MED FILL — ESTRADIOL-NORETHINDRONE ACE: 1-0.5 | 28 days supply | Qty: 28 | Fill #2

## 2018-03-09 DIAGNOSIS — M5031 Other cervical disc degeneration,  high cervical region: Secondary | ICD-10-CM | POA: Diagnosis not present

## 2018-03-09 DIAGNOSIS — M9901 Segmental and somatic dysfunction of cervical region: Secondary | ICD-10-CM | POA: Diagnosis not present

## 2018-03-10 DIAGNOSIS — M9901 Segmental and somatic dysfunction of cervical region: Secondary | ICD-10-CM | POA: Diagnosis not present

## 2018-03-10 DIAGNOSIS — M5031 Other cervical disc degeneration,  high cervical region: Secondary | ICD-10-CM | POA: Diagnosis not present

## 2018-03-30 DIAGNOSIS — M5031 Other cervical disc degeneration,  high cervical region: Secondary | ICD-10-CM | POA: Diagnosis not present

## 2018-03-30 DIAGNOSIS — M9901 Segmental and somatic dysfunction of cervical region: Secondary | ICD-10-CM | POA: Diagnosis not present

## 2018-04-01 MED FILL — LEVOTHYROXINE 100 MCG TABLE: 100 | 90 days supply | Qty: 90 | Fill #3

## 2018-04-04 DIAGNOSIS — Z23 Encounter for immunization: Secondary | ICD-10-CM | POA: Diagnosis not present

## 2018-04-04 MED FILL — ESTRADIOL-NORETHINDRONE ACE: 1-0.5 | 28 days supply | Qty: 28 | Fill #3

## 2018-04-19 DIAGNOSIS — M9901 Segmental and somatic dysfunction of cervical region: Secondary | ICD-10-CM | POA: Diagnosis not present

## 2018-04-19 DIAGNOSIS — M5031 Other cervical disc degeneration,  high cervical region: Secondary | ICD-10-CM | POA: Diagnosis not present

## 2018-04-20 DIAGNOSIS — M5031 Other cervical disc degeneration,  high cervical region: Secondary | ICD-10-CM | POA: Diagnosis not present

## 2018-04-20 DIAGNOSIS — M9901 Segmental and somatic dysfunction of cervical region: Secondary | ICD-10-CM | POA: Diagnosis not present

## 2018-05-02 MED FILL — ESTRADIOL-NORETHINDRONE ACE: 1-0.5 | 28 days supply | Qty: 28 | Fill #4

## 2018-05-12 DIAGNOSIS — M9901 Segmental and somatic dysfunction of cervical region: Secondary | ICD-10-CM | POA: Diagnosis not present

## 2018-05-12 DIAGNOSIS — M5031 Other cervical disc degeneration,  high cervical region: Secondary | ICD-10-CM | POA: Diagnosis not present

## 2018-05-20 DIAGNOSIS — L570 Actinic keratosis: Secondary | ICD-10-CM | POA: Diagnosis not present

## 2018-05-20 DIAGNOSIS — L814 Other melanin hyperpigmentation: Secondary | ICD-10-CM | POA: Diagnosis not present

## 2018-05-20 DIAGNOSIS — D2271 Melanocytic nevi of right lower limb, including hip: Secondary | ICD-10-CM | POA: Diagnosis not present

## 2018-05-20 DIAGNOSIS — D1801 Hemangioma of skin and subcutaneous tissue: Secondary | ICD-10-CM | POA: Diagnosis not present

## 2018-05-20 DIAGNOSIS — D225 Melanocytic nevi of trunk: Secondary | ICD-10-CM | POA: Diagnosis not present

## 2018-05-20 DIAGNOSIS — D485 Neoplasm of uncertain behavior of skin: Secondary | ICD-10-CM | POA: Diagnosis not present

## 2018-05-20 DIAGNOSIS — D2261 Melanocytic nevi of right upper limb, including shoulder: Secondary | ICD-10-CM | POA: Diagnosis not present

## 2018-05-20 DIAGNOSIS — Z85828 Personal history of other malignant neoplasm of skin: Secondary | ICD-10-CM | POA: Diagnosis not present

## 2018-05-20 DIAGNOSIS — D2272 Melanocytic nevi of left lower limb, including hip: Secondary | ICD-10-CM | POA: Diagnosis not present

## 2018-05-20 DIAGNOSIS — L821 Other seborrheic keratosis: Secondary | ICD-10-CM | POA: Diagnosis not present

## 2018-05-20 DIAGNOSIS — D171 Benign lipomatous neoplasm of skin and subcutaneous tissue of trunk: Secondary | ICD-10-CM | POA: Diagnosis not present

## 2018-05-25 DIAGNOSIS — M5031 Other cervical disc degeneration,  high cervical region: Secondary | ICD-10-CM | POA: Diagnosis not present

## 2018-05-25 DIAGNOSIS — M9901 Segmental and somatic dysfunction of cervical region: Secondary | ICD-10-CM | POA: Diagnosis not present

## 2018-06-01 DIAGNOSIS — Z79899 Other long term (current) drug therapy: Secondary | ICD-10-CM | POA: Diagnosis not present

## 2018-06-01 DIAGNOSIS — G4700 Insomnia, unspecified: Secondary | ICD-10-CM | POA: Diagnosis not present

## 2018-06-01 DIAGNOSIS — Z23 Encounter for immunization: Secondary | ICD-10-CM | POA: Diagnosis not present

## 2018-06-01 DIAGNOSIS — H9311 Tinnitus, right ear: Secondary | ICD-10-CM | POA: Diagnosis not present

## 2018-06-01 DIAGNOSIS — E039 Hypothyroidism, unspecified: Secondary | ICD-10-CM | POA: Diagnosis not present

## 2018-06-01 DIAGNOSIS — F419 Anxiety disorder, unspecified: Secondary | ICD-10-CM | POA: Diagnosis not present

## 2018-06-01 DIAGNOSIS — Z0000 Encounter for general adult medical examination without abnormal findings: Secondary | ICD-10-CM | POA: Diagnosis not present

## 2018-06-01 MED FILL — ESTRADIOL-NORETHINDRONE ACE: 1-0.5 | 28 days supply | Qty: 28 | Fill #5

## 2018-06-09 DIAGNOSIS — M5031 Other cervical disc degeneration,  high cervical region: Secondary | ICD-10-CM | POA: Diagnosis not present

## 2018-06-09 DIAGNOSIS — M9901 Segmental and somatic dysfunction of cervical region: Secondary | ICD-10-CM | POA: Diagnosis not present

## 2018-06-24 DIAGNOSIS — D485 Neoplasm of uncertain behavior of skin: Secondary | ICD-10-CM | POA: Diagnosis not present

## 2018-06-24 DIAGNOSIS — L905 Scar conditions and fibrosis of skin: Secondary | ICD-10-CM | POA: Diagnosis not present

## 2018-06-28 MED FILL — ESTRADIOL-NORETHINDRONE ACE: 1-0.5 | 28 days supply | Qty: 28 | Fill #6

## 2018-06-29 DIAGNOSIS — M5031 Other cervical disc degeneration,  high cervical region: Secondary | ICD-10-CM | POA: Diagnosis not present

## 2018-06-29 DIAGNOSIS — M9901 Segmental and somatic dysfunction of cervical region: Secondary | ICD-10-CM | POA: Diagnosis not present

## 2018-07-04 MED FILL — LEVOTHYROXINE 100 MCG TABLE: 100 | 90 days supply | Qty: 90 | Fill #0 | Status: TO

## 2018-07-12 DIAGNOSIS — M5031 Other cervical disc degeneration,  high cervical region: Secondary | ICD-10-CM | POA: Diagnosis not present

## 2018-07-12 DIAGNOSIS — M9901 Segmental and somatic dysfunction of cervical region: Secondary | ICD-10-CM | POA: Diagnosis not present

## 2018-07-14 DIAGNOSIS — M5031 Other cervical disc degeneration,  high cervical region: Secondary | ICD-10-CM | POA: Diagnosis not present

## 2018-07-14 DIAGNOSIS — M9901 Segmental and somatic dysfunction of cervical region: Secondary | ICD-10-CM | POA: Diagnosis not present

## 2018-07-26 DIAGNOSIS — D485 Neoplasm of uncertain behavior of skin: Secondary | ICD-10-CM | POA: Diagnosis not present

## 2018-07-26 DIAGNOSIS — L905 Scar conditions and fibrosis of skin: Secondary | ICD-10-CM | POA: Diagnosis not present

## 2018-07-26 MED FILL — ESTRADIOL-NORETHINDRONE ACE: 1-0.5 | 28 days supply | Qty: 28 | Fill #7 | Status: TO

## 2018-08-25 MED FILL — ESTRADIOL-NORETHINDRONE ACE: 1-0.5 | 28 days supply | Qty: 28 | Fill #0

## 2018-09-22 MED FILL — ESTRADIOL-NORETHINDRONE ACE: 1-0.5 | 28 days supply | Qty: 28 | Fill #1

## 2018-10-04 MED FILL — LEVOTHYROXINE 100 MCG TAB: 100 | 90 days supply | Qty: 90 | Fill #0

## 2018-10-31 MED FILL — ESTRADIOL-NORETHINDRONE ACE: 1-0.5 | 28 days supply | Qty: 28 | Fill #2

## 2018-11-28 MED FILL — ESTRADIOL-NORETHINDRONE ACE: 1-0.5 | 56 days supply | Qty: 56 | Fill #0

## 2018-12-09 DIAGNOSIS — L820 Inflamed seborrheic keratosis: Secondary | ICD-10-CM | POA: Diagnosis not present

## 2018-12-09 DIAGNOSIS — L570 Actinic keratosis: Secondary | ICD-10-CM | POA: Diagnosis not present

## 2018-12-09 DIAGNOSIS — L821 Other seborrheic keratosis: Secondary | ICD-10-CM | POA: Diagnosis not present

## 2018-12-09 DIAGNOSIS — D1801 Hemangioma of skin and subcutaneous tissue: Secondary | ICD-10-CM | POA: Diagnosis not present

## 2018-12-21 DIAGNOSIS — M9903 Segmental and somatic dysfunction of lumbar region: Secondary | ICD-10-CM | POA: Diagnosis not present

## 2018-12-21 DIAGNOSIS — M5136 Other intervertebral disc degeneration, lumbar region: Secondary | ICD-10-CM | POA: Diagnosis not present

## 2018-12-21 DIAGNOSIS — M9904 Segmental and somatic dysfunction of sacral region: Secondary | ICD-10-CM | POA: Diagnosis not present

## 2018-12-21 DIAGNOSIS — M9905 Segmental and somatic dysfunction of pelvic region: Secondary | ICD-10-CM | POA: Diagnosis not present

## 2018-12-23 DIAGNOSIS — Z124 Encounter for screening for malignant neoplasm of cervix: Secondary | ICD-10-CM | POA: Diagnosis not present

## 2018-12-23 DIAGNOSIS — Z01419 Encounter for gynecological examination (general) (routine) without abnormal findings: Secondary | ICD-10-CM | POA: Diagnosis not present

## 2018-12-23 DIAGNOSIS — Z1231 Encounter for screening mammogram for malignant neoplasm of breast: Secondary | ICD-10-CM | POA: Diagnosis not present

## 2018-12-25 MED FILL — LEVOTHYROXINE 100 MCG TAB: 100 | 90 days supply | Qty: 90 | Fill #1

## 2018-12-27 DIAGNOSIS — M9904 Segmental and somatic dysfunction of sacral region: Secondary | ICD-10-CM | POA: Diagnosis not present

## 2018-12-27 DIAGNOSIS — M9903 Segmental and somatic dysfunction of lumbar region: Secondary | ICD-10-CM | POA: Diagnosis not present

## 2018-12-27 DIAGNOSIS — M5136 Other intervertebral disc degeneration, lumbar region: Secondary | ICD-10-CM | POA: Diagnosis not present

## 2018-12-27 DIAGNOSIS — M9905 Segmental and somatic dysfunction of pelvic region: Secondary | ICD-10-CM | POA: Diagnosis not present

## 2019-01-19 MED FILL — ESTRADIOL-NORETHINDRONE ACE: 1-0.5 | 84 days supply | Qty: 84 | Fill #0

## 2019-01-26 DIAGNOSIS — M5136 Other intervertebral disc degeneration, lumbar region: Secondary | ICD-10-CM | POA: Diagnosis not present

## 2019-01-26 DIAGNOSIS — M9904 Segmental and somatic dysfunction of sacral region: Secondary | ICD-10-CM | POA: Diagnosis not present

## 2019-01-26 DIAGNOSIS — M9905 Segmental and somatic dysfunction of pelvic region: Secondary | ICD-10-CM | POA: Diagnosis not present

## 2019-01-26 DIAGNOSIS — M9903 Segmental and somatic dysfunction of lumbar region: Secondary | ICD-10-CM | POA: Diagnosis not present

## 2019-02-07 DIAGNOSIS — M9904 Segmental and somatic dysfunction of sacral region: Secondary | ICD-10-CM | POA: Diagnosis not present

## 2019-02-07 DIAGNOSIS — D171 Benign lipomatous neoplasm of skin and subcutaneous tissue of trunk: Secondary | ICD-10-CM | POA: Diagnosis not present

## 2019-02-07 DIAGNOSIS — M9903 Segmental and somatic dysfunction of lumbar region: Secondary | ICD-10-CM | POA: Diagnosis not present

## 2019-02-07 DIAGNOSIS — M5136 Other intervertebral disc degeneration, lumbar region: Secondary | ICD-10-CM | POA: Diagnosis not present

## 2019-02-07 DIAGNOSIS — M9905 Segmental and somatic dysfunction of pelvic region: Secondary | ICD-10-CM | POA: Diagnosis not present

## 2019-02-13 DIAGNOSIS — J209 Acute bronchitis, unspecified: Secondary | ICD-10-CM | POA: Diagnosis not present

## 2019-02-13 DIAGNOSIS — R05 Cough: Secondary | ICD-10-CM | POA: Diagnosis not present

## 2019-02-20 DIAGNOSIS — H109 Unspecified conjunctivitis: Secondary | ICD-10-CM | POA: Diagnosis not present

## 2019-02-20 DIAGNOSIS — J301 Allergic rhinitis due to pollen: Secondary | ICD-10-CM | POA: Diagnosis not present

## 2019-03-08 DIAGNOSIS — M5136 Other intervertebral disc degeneration, lumbar region: Secondary | ICD-10-CM | POA: Diagnosis not present

## 2019-03-08 DIAGNOSIS — M5031 Other cervical disc degeneration,  high cervical region: Secondary | ICD-10-CM | POA: Diagnosis not present

## 2019-03-08 DIAGNOSIS — M9901 Segmental and somatic dysfunction of cervical region: Secondary | ICD-10-CM | POA: Diagnosis not present

## 2019-03-08 DIAGNOSIS — M9903 Segmental and somatic dysfunction of lumbar region: Secondary | ICD-10-CM | POA: Diagnosis not present

## 2019-03-14 DIAGNOSIS — M5136 Other intervertebral disc degeneration, lumbar region: Secondary | ICD-10-CM | POA: Diagnosis not present

## 2019-03-14 DIAGNOSIS — M5031 Other cervical disc degeneration,  high cervical region: Secondary | ICD-10-CM | POA: Diagnosis not present

## 2019-03-14 DIAGNOSIS — M9901 Segmental and somatic dysfunction of cervical region: Secondary | ICD-10-CM | POA: Diagnosis not present

## 2019-03-14 DIAGNOSIS — M9903 Segmental and somatic dysfunction of lumbar region: Secondary | ICD-10-CM | POA: Diagnosis not present

## 2019-03-17 MED FILL — LEVOTHYROXINE 100 MCG TAB: 100 | 90 days supply | Qty: 90 | Fill #2

## 2019-04-24 DIAGNOSIS — M9903 Segmental and somatic dysfunction of lumbar region: Secondary | ICD-10-CM | POA: Diagnosis not present

## 2019-04-24 DIAGNOSIS — M5031 Other cervical disc degeneration,  high cervical region: Secondary | ICD-10-CM | POA: Diagnosis not present

## 2019-04-24 DIAGNOSIS — M5136 Other intervertebral disc degeneration, lumbar region: Secondary | ICD-10-CM | POA: Diagnosis not present

## 2019-04-24 DIAGNOSIS — M9901 Segmental and somatic dysfunction of cervical region: Secondary | ICD-10-CM | POA: Diagnosis not present

## 2019-04-24 MED FILL — ESTRADIOL-NORETHINDRONE ACE: 1-0.5 | 84 days supply | Qty: 84 | Fill #1

## 2019-04-25 DIAGNOSIS — M9903 Segmental and somatic dysfunction of lumbar region: Secondary | ICD-10-CM | POA: Diagnosis not present

## 2019-04-25 DIAGNOSIS — M5031 Other cervical disc degeneration,  high cervical region: Secondary | ICD-10-CM | POA: Diagnosis not present

## 2019-04-25 DIAGNOSIS — M9901 Segmental and somatic dysfunction of cervical region: Secondary | ICD-10-CM | POA: Diagnosis not present

## 2019-04-25 DIAGNOSIS — M5136 Other intervertebral disc degeneration, lumbar region: Secondary | ICD-10-CM | POA: Diagnosis not present

## 2019-04-27 DIAGNOSIS — M9903 Segmental and somatic dysfunction of lumbar region: Secondary | ICD-10-CM | POA: Diagnosis not present

## 2019-04-27 DIAGNOSIS — M9901 Segmental and somatic dysfunction of cervical region: Secondary | ICD-10-CM | POA: Diagnosis not present

## 2019-04-27 DIAGNOSIS — M5136 Other intervertebral disc degeneration, lumbar region: Secondary | ICD-10-CM | POA: Diagnosis not present

## 2019-04-27 DIAGNOSIS — M5031 Other cervical disc degeneration,  high cervical region: Secondary | ICD-10-CM | POA: Diagnosis not present

## 2019-05-02 DIAGNOSIS — M9903 Segmental and somatic dysfunction of lumbar region: Secondary | ICD-10-CM | POA: Diagnosis not present

## 2019-05-02 DIAGNOSIS — M5136 Other intervertebral disc degeneration, lumbar region: Secondary | ICD-10-CM | POA: Diagnosis not present

## 2019-05-02 DIAGNOSIS — M9901 Segmental and somatic dysfunction of cervical region: Secondary | ICD-10-CM | POA: Diagnosis not present

## 2019-05-02 DIAGNOSIS — M5031 Other cervical disc degeneration,  high cervical region: Secondary | ICD-10-CM | POA: Diagnosis not present

## 2019-06-06 ENCOUNTER — Ambulatory Visit: Payer: PPO

## 2019-06-15 ENCOUNTER — Ambulatory Visit: Payer: PPO | Attending: Internal Medicine

## 2019-06-15 DIAGNOSIS — Z23 Encounter for immunization: Secondary | ICD-10-CM | POA: Insufficient documentation

## 2019-06-15 NOTE — Progress Notes (Signed)
   Covid-19 Vaccination Clinic  Name:  Renee Montgomery    MRN: [id number] DOB: 02/27/1951  06/15/2019  [name] was observed post Covid-19 immunization for 15 minutes without incidence. She was provided with Vaccine Information Sheet and instruction to access the V-Safe system.   [name] was instructed to call 911 with any severe reactions post vaccine: Marland Kitchen Difficulty breathing  . Swelling of your face and throat  . A fast heartbeat  . A bad rash all over your body  . Dizziness and weakness    Immunizations Administered    Name Date Dose VIS Date Route   Pfizer COVID-19 Vaccine 06/15/2019  5:04 PM 0.3 mL 04/21/2019 Intramuscular   Manufacturer: [place]   Lot: [id number]   [place]: [id number]

## 2019-06-21 DIAGNOSIS — F419 Anxiety disorder, unspecified: Secondary | ICD-10-CM | POA: Diagnosis not present

## 2019-06-21 DIAGNOSIS — E039 Hypothyroidism, unspecified: Secondary | ICD-10-CM | POA: Diagnosis not present

## 2019-06-21 DIAGNOSIS — Z136 Encounter for screening for cardiovascular disorders: Secondary | ICD-10-CM | POA: Diagnosis not present

## 2019-06-21 DIAGNOSIS — G4700 Insomnia, unspecified: Secondary | ICD-10-CM | POA: Diagnosis not present

## 2019-06-21 DIAGNOSIS — Z0000 Encounter for general adult medical examination without abnormal findings: Secondary | ICD-10-CM | POA: Diagnosis not present

## 2019-06-21 DIAGNOSIS — Z79899 Other long term (current) drug therapy: Secondary | ICD-10-CM | POA: Diagnosis not present

## 2019-06-28 MED FILL — LEVOTHYROXINE SODIUM 100 MC: 100 | 90 days supply | Qty: 90 | Fill #0

## 2019-07-04 ENCOUNTER — Ambulatory Visit: Payer: PPO

## 2019-07-08 MED FILL — ESTRADIOL-NORETHINDRONE ACE: 1-0.5 | 84 days supply | Qty: 84 | Fill #2

## 2019-07-11 ENCOUNTER — Ambulatory Visit: Payer: PPO | Attending: Internal Medicine

## 2019-07-11 ENCOUNTER — Ambulatory Visit: Payer: PPO

## 2019-07-11 DIAGNOSIS — Z23 Encounter for immunization: Secondary | ICD-10-CM | POA: Insufficient documentation

## 2019-07-11 NOTE — Progress Notes (Signed)
   Covid-19 Vaccination Clinic  Name:  RAYNIAH BENDIXEN    MRN: [id number] DOB: [birth year]  07/11/2019  Ms. Llamas was observed post Covid-19 immunization for 15 minutes without incident. She was provided with Vaccine Information Sheet and instruction to access the V-Safe system.   Ms. Verzosa was instructed to call 911 with any severe reactions post vaccine: Marland Kitchen Difficulty breathing  . Swelling of face and throat  . A fast heartbeat  . A bad rash all over body  . Dizziness and weakness   Immunizations Administered    Name Date Dose VIS Date Route   Pfizer COVID-19 Vaccine 07/11/2019 10:52 AM 0.3 mL 04/21/2019 Intramuscular   Manufacturer: [place]   Lot: [id number]   [place]: [id number]

## 2019-07-21 DIAGNOSIS — D2272 Melanocytic nevi of left lower limb, including hip: Secondary | ICD-10-CM | POA: Diagnosis not present

## 2019-07-21 DIAGNOSIS — I788 Other diseases of capillaries: Secondary | ICD-10-CM | POA: Diagnosis not present

## 2019-07-21 DIAGNOSIS — L821 Other seborrheic keratosis: Secondary | ICD-10-CM | POA: Diagnosis not present

## 2019-07-21 DIAGNOSIS — D2262 Melanocytic nevi of left upper limb, including shoulder: Secondary | ICD-10-CM | POA: Diagnosis not present

## 2019-07-21 DIAGNOSIS — D225 Melanocytic nevi of trunk: Secondary | ICD-10-CM | POA: Diagnosis not present

## 2019-07-21 DIAGNOSIS — I8391 Asymptomatic varicose veins of right lower extremity: Secondary | ICD-10-CM | POA: Diagnosis not present

## 2019-07-21 DIAGNOSIS — L814 Other melanin hyperpigmentation: Secondary | ICD-10-CM | POA: Diagnosis not present

## 2019-07-21 DIAGNOSIS — D2271 Melanocytic nevi of right lower limb, including hip: Secondary | ICD-10-CM | POA: Diagnosis not present

## 2019-07-21 DIAGNOSIS — D2261 Melanocytic nevi of right upper limb, including shoulder: Secondary | ICD-10-CM | POA: Diagnosis not present

## 2019-07-26 DIAGNOSIS — M9903 Segmental and somatic dysfunction of lumbar region: Secondary | ICD-10-CM | POA: Diagnosis not present

## 2019-07-26 DIAGNOSIS — M5136 Other intervertebral disc degeneration, lumbar region: Secondary | ICD-10-CM | POA: Diagnosis not present

## 2019-07-26 DIAGNOSIS — M9905 Segmental and somatic dysfunction of pelvic region: Secondary | ICD-10-CM | POA: Diagnosis not present

## 2019-07-26 DIAGNOSIS — M9904 Segmental and somatic dysfunction of sacral region: Secondary | ICD-10-CM | POA: Diagnosis not present

## 2019-08-01 DIAGNOSIS — M9905 Segmental and somatic dysfunction of pelvic region: Secondary | ICD-10-CM | POA: Diagnosis not present

## 2019-08-01 DIAGNOSIS — M9904 Segmental and somatic dysfunction of sacral region: Secondary | ICD-10-CM | POA: Diagnosis not present

## 2019-08-01 DIAGNOSIS — M9903 Segmental and somatic dysfunction of lumbar region: Secondary | ICD-10-CM | POA: Diagnosis not present

## 2019-08-01 DIAGNOSIS — M5136 Other intervertebral disc degeneration, lumbar region: Secondary | ICD-10-CM | POA: Diagnosis not present

## 2019-08-22 DIAGNOSIS — M5136 Other intervertebral disc degeneration, lumbar region: Secondary | ICD-10-CM | POA: Diagnosis not present

## 2019-08-22 DIAGNOSIS — M9904 Segmental and somatic dysfunction of sacral region: Secondary | ICD-10-CM | POA: Diagnosis not present

## 2019-08-22 DIAGNOSIS — M9903 Segmental and somatic dysfunction of lumbar region: Secondary | ICD-10-CM | POA: Diagnosis not present

## 2019-08-22 DIAGNOSIS — M9905 Segmental and somatic dysfunction of pelvic region: Secondary | ICD-10-CM | POA: Diagnosis not present

## 2019-09-13 ENCOUNTER — Other Ambulatory Visit (HOSPITAL_COMMUNITY): Payer: Self-pay | Admitting: Family Medicine

## 2019-09-13 MED FILL — LEVOTHYROXINE SODIUM 100 MC: 100 | 90 days supply | Qty: 90 | Fill #0

## 2019-10-25 DIAGNOSIS — M9904 Segmental and somatic dysfunction of sacral region: Secondary | ICD-10-CM | POA: Diagnosis not present

## 2019-10-25 DIAGNOSIS — M9903 Segmental and somatic dysfunction of lumbar region: Secondary | ICD-10-CM | POA: Diagnosis not present

## 2019-10-25 DIAGNOSIS — M5136 Other intervertebral disc degeneration, lumbar region: Secondary | ICD-10-CM | POA: Diagnosis not present

## 2019-10-25 DIAGNOSIS — M9905 Segmental and somatic dysfunction of pelvic region: Secondary | ICD-10-CM | POA: Diagnosis not present

## 2019-12-07 DIAGNOSIS — M5136 Other intervertebral disc degeneration, lumbar region: Secondary | ICD-10-CM | POA: Diagnosis not present

## 2019-12-07 DIAGNOSIS — M50322 Other cervical disc degeneration at C5-C6 level: Secondary | ICD-10-CM | POA: Diagnosis not present

## 2019-12-07 DIAGNOSIS — M9901 Segmental and somatic dysfunction of cervical region: Secondary | ICD-10-CM | POA: Diagnosis not present

## 2019-12-07 DIAGNOSIS — M9903 Segmental and somatic dysfunction of lumbar region: Secondary | ICD-10-CM | POA: Diagnosis not present

## 2019-12-21 MED FILL — LEVOTHYROXINE SODIUM 100 MC: 100 | 90 days supply | Qty: 90 | Fill #1

## 2019-12-21 MED FILL — ESTRADIOL-NORETHINDRONE ACE: 1-0.5 | 84 days supply | Qty: 84 | Fill #4

## 2020-01-03 DIAGNOSIS — Z1231 Encounter for screening mammogram for malignant neoplasm of breast: Secondary | ICD-10-CM | POA: Diagnosis not present

## 2020-02-13 ENCOUNTER — Ambulatory Visit: Payer: PPO | Attending: Internal Medicine

## 2020-02-13 DIAGNOSIS — Z23 Encounter for immunization: Secondary | ICD-10-CM

## 2020-02-13 NOTE — Progress Notes (Signed)
° °  Covid-19 Vaccination Clinic  Name:  SENAI RAMNATH    MRN: [id number] DOB: November 18, 1950  02/13/2020  Ms. Zamor was observed post Covid-19 immunization for 15 minutes without incident. She was provided with Vaccine Information Sheet and instruction to access the V-Safe system.   Ms. Ravert was instructed to call 911 with any severe reactions post vaccine:  Difficulty breathing   Swelling of face and throat   A fast heartbeat   A bad rash all over body   Dizziness and weakness

## 2020-02-13 NOTE — Progress Notes (Signed)
   Covid-19 Vaccination Clinic  Name:  Renee Montgomery    MRN: [id number] DOB: 1950-07-29  02/13/2020  Renee Montgomery was observed post Covid-19 immunization for 15 minutes without incident. She was provided with Vaccine Information Sheet and instruction to access the V-Safe system.   Renee Montgomery was instructed to call 911 with any severe reactions post vaccine: Marland Kitchen Difficulty breathing  . Swelling of face and throat  . A fast heartbeat  . A bad rash all over body  . Dizziness and weakness

## 2020-03-04 DIAGNOSIS — M9901 Segmental and somatic dysfunction of cervical region: Secondary | ICD-10-CM | POA: Diagnosis not present

## 2020-03-04 DIAGNOSIS — M50322 Other cervical disc degeneration at C5-C6 level: Secondary | ICD-10-CM | POA: Diagnosis not present

## 2020-03-04 DIAGNOSIS — M5136 Other intervertebral disc degeneration, lumbar region: Secondary | ICD-10-CM | POA: Diagnosis not present

## 2020-03-04 DIAGNOSIS — M9903 Segmental and somatic dysfunction of lumbar region: Secondary | ICD-10-CM | POA: Diagnosis not present

## 2020-03-05 DIAGNOSIS — M50322 Other cervical disc degeneration at C5-C6 level: Secondary | ICD-10-CM | POA: Diagnosis not present

## 2020-03-05 DIAGNOSIS — M9903 Segmental and somatic dysfunction of lumbar region: Secondary | ICD-10-CM | POA: Diagnosis not present

## 2020-03-05 DIAGNOSIS — M5136 Other intervertebral disc degeneration, lumbar region: Secondary | ICD-10-CM | POA: Diagnosis not present

## 2020-03-05 DIAGNOSIS — M9901 Segmental and somatic dysfunction of cervical region: Secondary | ICD-10-CM | POA: Diagnosis not present

## 2020-03-07 DIAGNOSIS — M9903 Segmental and somatic dysfunction of lumbar region: Secondary | ICD-10-CM | POA: Diagnosis not present

## 2020-03-07 DIAGNOSIS — M5136 Other intervertebral disc degeneration, lumbar region: Secondary | ICD-10-CM | POA: Diagnosis not present

## 2020-03-07 DIAGNOSIS — M9901 Segmental and somatic dysfunction of cervical region: Secondary | ICD-10-CM | POA: Diagnosis not present

## 2020-03-07 DIAGNOSIS — M50322 Other cervical disc degeneration at C5-C6 level: Secondary | ICD-10-CM | POA: Diagnosis not present

## 2020-03-12 DIAGNOSIS — M9903 Segmental and somatic dysfunction of lumbar region: Secondary | ICD-10-CM | POA: Diagnosis not present

## 2020-03-12 DIAGNOSIS — M5136 Other intervertebral disc degeneration, lumbar region: Secondary | ICD-10-CM | POA: Diagnosis not present

## 2020-03-12 DIAGNOSIS — M50322 Other cervical disc degeneration at C5-C6 level: Secondary | ICD-10-CM | POA: Diagnosis not present

## 2020-03-12 DIAGNOSIS — M9901 Segmental and somatic dysfunction of cervical region: Secondary | ICD-10-CM | POA: Diagnosis not present

## 2020-03-13 DIAGNOSIS — M9903 Segmental and somatic dysfunction of lumbar region: Secondary | ICD-10-CM | POA: Diagnosis not present

## 2020-03-13 DIAGNOSIS — M9901 Segmental and somatic dysfunction of cervical region: Secondary | ICD-10-CM | POA: Diagnosis not present

## 2020-03-13 DIAGNOSIS — M50322 Other cervical disc degeneration at C5-C6 level: Secondary | ICD-10-CM | POA: Diagnosis not present

## 2020-03-13 DIAGNOSIS — M5136 Other intervertebral disc degeneration, lumbar region: Secondary | ICD-10-CM | POA: Diagnosis not present

## 2020-03-19 DIAGNOSIS — M9901 Segmental and somatic dysfunction of cervical region: Secondary | ICD-10-CM | POA: Diagnosis not present

## 2020-03-19 DIAGNOSIS — M5136 Other intervertebral disc degeneration, lumbar region: Secondary | ICD-10-CM | POA: Diagnosis not present

## 2020-03-19 DIAGNOSIS — M50322 Other cervical disc degeneration at C5-C6 level: Secondary | ICD-10-CM | POA: Diagnosis not present

## 2020-03-19 DIAGNOSIS — M9903 Segmental and somatic dysfunction of lumbar region: Secondary | ICD-10-CM | POA: Diagnosis not present

## 2020-03-20 DIAGNOSIS — M9901 Segmental and somatic dysfunction of cervical region: Secondary | ICD-10-CM | POA: Diagnosis not present

## 2020-03-20 DIAGNOSIS — M50322 Other cervical disc degeneration at C5-C6 level: Secondary | ICD-10-CM | POA: Diagnosis not present

## 2020-03-20 DIAGNOSIS — M9903 Segmental and somatic dysfunction of lumbar region: Secondary | ICD-10-CM | POA: Diagnosis not present

## 2020-03-20 DIAGNOSIS — M5136 Other intervertebral disc degeneration, lumbar region: Secondary | ICD-10-CM | POA: Diagnosis not present

## 2020-03-20 MED FILL — LEVOTHYROXINE SODIUM 100 MC: 100 | 90 days supply | Qty: 90 | Fill #2

## 2020-03-21 ENCOUNTER — Other Ambulatory Visit (HOSPITAL_COMMUNITY): Payer: Self-pay | Admitting: Obstetrics and Gynecology

## 2020-03-21 MED FILL — ESTRADIOL-NORETHINDRONE ACE: 1-0.5 | 28 days supply | Qty: 28 | Fill #0

## 2020-03-22 ENCOUNTER — Other Ambulatory Visit (HOSPITAL_COMMUNITY): Payer: Self-pay | Admitting: Family Medicine

## 2020-03-22 MED FILL — ALPRAZolam 0.25 MG TABS: 0.25 | 90 days supply | Qty: 90 | Fill #0

## 2020-03-22 MED FILL — ZOLPIDEM TARTRATE 10 MG TAB: 10 | 90 days supply | Qty: 90 | Fill #0

## 2020-03-26 ENCOUNTER — Other Ambulatory Visit (HOSPITAL_COMMUNITY): Payer: Self-pay | Admitting: Obstetrics and Gynecology

## 2020-03-26 DIAGNOSIS — Z79890 Hormone replacement therapy (postmenopausal): Secondary | ICD-10-CM | POA: Diagnosis not present

## 2020-03-26 DIAGNOSIS — M8580 Other specified disorders of bone density and structure, unspecified site: Secondary | ICD-10-CM | POA: Diagnosis not present

## 2020-03-28 DIAGNOSIS — M5136 Other intervertebral disc degeneration, lumbar region: Secondary | ICD-10-CM | POA: Diagnosis not present

## 2020-03-28 DIAGNOSIS — M9901 Segmental and somatic dysfunction of cervical region: Secondary | ICD-10-CM | POA: Diagnosis not present

## 2020-03-28 DIAGNOSIS — M50322 Other cervical disc degeneration at C5-C6 level: Secondary | ICD-10-CM | POA: Diagnosis not present

## 2020-03-28 DIAGNOSIS — M9903 Segmental and somatic dysfunction of lumbar region: Secondary | ICD-10-CM | POA: Diagnosis not present

## 2020-04-03 DIAGNOSIS — M9901 Segmental and somatic dysfunction of cervical region: Secondary | ICD-10-CM | POA: Diagnosis not present

## 2020-04-03 DIAGNOSIS — M5136 Other intervertebral disc degeneration, lumbar region: Secondary | ICD-10-CM | POA: Diagnosis not present

## 2020-04-03 DIAGNOSIS — M9903 Segmental and somatic dysfunction of lumbar region: Secondary | ICD-10-CM | POA: Diagnosis not present

## 2020-04-03 DIAGNOSIS — M50322 Other cervical disc degeneration at C5-C6 level: Secondary | ICD-10-CM | POA: Diagnosis not present

## 2020-04-10 DIAGNOSIS — M9901 Segmental and somatic dysfunction of cervical region: Secondary | ICD-10-CM | POA: Diagnosis not present

## 2020-04-10 DIAGNOSIS — M9903 Segmental and somatic dysfunction of lumbar region: Secondary | ICD-10-CM | POA: Diagnosis not present

## 2020-04-10 DIAGNOSIS — M50322 Other cervical disc degeneration at C5-C6 level: Secondary | ICD-10-CM | POA: Diagnosis not present

## 2020-04-10 DIAGNOSIS — M5136 Other intervertebral disc degeneration, lumbar region: Secondary | ICD-10-CM | POA: Diagnosis not present

## 2020-04-17 DIAGNOSIS — M9901 Segmental and somatic dysfunction of cervical region: Secondary | ICD-10-CM | POA: Diagnosis not present

## 2020-04-17 DIAGNOSIS — M5136 Other intervertebral disc degeneration, lumbar region: Secondary | ICD-10-CM | POA: Diagnosis not present

## 2020-04-17 DIAGNOSIS — M9903 Segmental and somatic dysfunction of lumbar region: Secondary | ICD-10-CM | POA: Diagnosis not present

## 2020-04-17 DIAGNOSIS — M50322 Other cervical disc degeneration at C5-C6 level: Secondary | ICD-10-CM | POA: Diagnosis not present

## 2020-04-24 DIAGNOSIS — M5136 Other intervertebral disc degeneration, lumbar region: Secondary | ICD-10-CM | POA: Diagnosis not present

## 2020-04-24 DIAGNOSIS — M9903 Segmental and somatic dysfunction of lumbar region: Secondary | ICD-10-CM | POA: Diagnosis not present

## 2020-04-24 DIAGNOSIS — M9901 Segmental and somatic dysfunction of cervical region: Secondary | ICD-10-CM | POA: Diagnosis not present

## 2020-04-24 DIAGNOSIS — M50322 Other cervical disc degeneration at C5-C6 level: Secondary | ICD-10-CM | POA: Diagnosis not present

## 2020-04-25 MED FILL — ESTRADIOL-NORETHINDRONE ACE: 1-0.5 | 84 days supply | Qty: 84 | Fill #0

## 2020-05-15 DIAGNOSIS — M5136 Other intervertebral disc degeneration, lumbar region: Secondary | ICD-10-CM | POA: Diagnosis not present

## 2020-05-15 DIAGNOSIS — M9903 Segmental and somatic dysfunction of lumbar region: Secondary | ICD-10-CM | POA: Diagnosis not present

## 2020-05-15 DIAGNOSIS — M9905 Segmental and somatic dysfunction of pelvic region: Secondary | ICD-10-CM | POA: Diagnosis not present

## 2020-05-15 DIAGNOSIS — M9904 Segmental and somatic dysfunction of sacral region: Secondary | ICD-10-CM | POA: Diagnosis not present

## 2020-05-22 DIAGNOSIS — M9903 Segmental and somatic dysfunction of lumbar region: Secondary | ICD-10-CM | POA: Diagnosis not present

## 2020-05-22 DIAGNOSIS — M9904 Segmental and somatic dysfunction of sacral region: Secondary | ICD-10-CM | POA: Diagnosis not present

## 2020-05-22 DIAGNOSIS — M9905 Segmental and somatic dysfunction of pelvic region: Secondary | ICD-10-CM | POA: Diagnosis not present

## 2020-05-22 DIAGNOSIS — M5136 Other intervertebral disc degeneration, lumbar region: Secondary | ICD-10-CM | POA: Diagnosis not present

## 2020-05-29 DIAGNOSIS — M9904 Segmental and somatic dysfunction of sacral region: Secondary | ICD-10-CM | POA: Diagnosis not present

## 2020-05-29 DIAGNOSIS — M9905 Segmental and somatic dysfunction of pelvic region: Secondary | ICD-10-CM | POA: Diagnosis not present

## 2020-05-29 DIAGNOSIS — M5136 Other intervertebral disc degeneration, lumbar region: Secondary | ICD-10-CM | POA: Diagnosis not present

## 2020-05-29 DIAGNOSIS — M9903 Segmental and somatic dysfunction of lumbar region: Secondary | ICD-10-CM | POA: Diagnosis not present

## 2020-06-05 DIAGNOSIS — M9903 Segmental and somatic dysfunction of lumbar region: Secondary | ICD-10-CM | POA: Diagnosis not present

## 2020-06-05 DIAGNOSIS — M9905 Segmental and somatic dysfunction of pelvic region: Secondary | ICD-10-CM | POA: Diagnosis not present

## 2020-06-05 DIAGNOSIS — M9904 Segmental and somatic dysfunction of sacral region: Secondary | ICD-10-CM | POA: Diagnosis not present

## 2020-06-05 DIAGNOSIS — M5136 Other intervertebral disc degeneration, lumbar region: Secondary | ICD-10-CM | POA: Diagnosis not present

## 2020-06-12 DIAGNOSIS — M9905 Segmental and somatic dysfunction of pelvic region: Secondary | ICD-10-CM | POA: Diagnosis not present

## 2020-06-12 DIAGNOSIS — M9903 Segmental and somatic dysfunction of lumbar region: Secondary | ICD-10-CM | POA: Diagnosis not present

## 2020-06-12 DIAGNOSIS — M9904 Segmental and somatic dysfunction of sacral region: Secondary | ICD-10-CM | POA: Diagnosis not present

## 2020-06-12 DIAGNOSIS — M5136 Other intervertebral disc degeneration, lumbar region: Secondary | ICD-10-CM | POA: Diagnosis not present

## 2020-06-14 ENCOUNTER — Other Ambulatory Visit (HOSPITAL_COMMUNITY): Payer: Self-pay | Admitting: Family Medicine

## 2020-06-14 MED FILL — LEVOTHYROXINE SODIUM 100 MC: 100 | 90 days supply | Qty: 90 | Fill #0

## 2020-06-19 DIAGNOSIS — M5136 Other intervertebral disc degeneration, lumbar region: Secondary | ICD-10-CM | POA: Diagnosis not present

## 2020-06-19 DIAGNOSIS — M9905 Segmental and somatic dysfunction of pelvic region: Secondary | ICD-10-CM | POA: Diagnosis not present

## 2020-06-19 DIAGNOSIS — M9904 Segmental and somatic dysfunction of sacral region: Secondary | ICD-10-CM | POA: Diagnosis not present

## 2020-06-19 DIAGNOSIS — M9903 Segmental and somatic dysfunction of lumbar region: Secondary | ICD-10-CM | POA: Diagnosis not present

## 2020-06-25 ENCOUNTER — Other Ambulatory Visit: Payer: Self-pay | Admitting: Family Medicine

## 2020-06-25 DIAGNOSIS — F419 Anxiety disorder, unspecified: Secondary | ICD-10-CM | POA: Diagnosis not present

## 2020-06-25 DIAGNOSIS — Z23 Encounter for immunization: Secondary | ICD-10-CM | POA: Diagnosis not present

## 2020-06-25 DIAGNOSIS — E039 Hypothyroidism, unspecified: Secondary | ICD-10-CM | POA: Diagnosis not present

## 2020-06-25 DIAGNOSIS — E2839 Other primary ovarian failure: Secondary | ICD-10-CM

## 2020-06-25 DIAGNOSIS — Z0000 Encounter for general adult medical examination without abnormal findings: Secondary | ICD-10-CM | POA: Diagnosis not present

## 2020-06-25 DIAGNOSIS — J301 Allergic rhinitis due to pollen: Secondary | ICD-10-CM | POA: Diagnosis not present

## 2020-06-25 DIAGNOSIS — G4700 Insomnia, unspecified: Secondary | ICD-10-CM | POA: Diagnosis not present

## 2020-06-25 DIAGNOSIS — Z79899 Other long term (current) drug therapy: Secondary | ICD-10-CM | POA: Diagnosis not present

## 2020-06-26 DIAGNOSIS — M9905 Segmental and somatic dysfunction of pelvic region: Secondary | ICD-10-CM | POA: Diagnosis not present

## 2020-06-26 DIAGNOSIS — M9903 Segmental and somatic dysfunction of lumbar region: Secondary | ICD-10-CM | POA: Diagnosis not present

## 2020-06-26 DIAGNOSIS — M9904 Segmental and somatic dysfunction of sacral region: Secondary | ICD-10-CM | POA: Diagnosis not present

## 2020-06-26 DIAGNOSIS — M5136 Other intervertebral disc degeneration, lumbar region: Secondary | ICD-10-CM | POA: Diagnosis not present

## 2020-07-09 DIAGNOSIS — M5136 Other intervertebral disc degeneration, lumbar region: Secondary | ICD-10-CM | POA: Diagnosis not present

## 2020-07-09 DIAGNOSIS — M9904 Segmental and somatic dysfunction of sacral region: Secondary | ICD-10-CM | POA: Diagnosis not present

## 2020-07-09 DIAGNOSIS — M9905 Segmental and somatic dysfunction of pelvic region: Secondary | ICD-10-CM | POA: Diagnosis not present

## 2020-07-09 DIAGNOSIS — M9903 Segmental and somatic dysfunction of lumbar region: Secondary | ICD-10-CM | POA: Diagnosis not present

## 2020-07-23 DIAGNOSIS — M9903 Segmental and somatic dysfunction of lumbar region: Secondary | ICD-10-CM | POA: Diagnosis not present

## 2020-07-23 DIAGNOSIS — M9905 Segmental and somatic dysfunction of pelvic region: Secondary | ICD-10-CM | POA: Diagnosis not present

## 2020-07-23 DIAGNOSIS — M5136 Other intervertebral disc degeneration, lumbar region: Secondary | ICD-10-CM | POA: Diagnosis not present

## 2020-07-23 DIAGNOSIS — M9904 Segmental and somatic dysfunction of sacral region: Secondary | ICD-10-CM | POA: Diagnosis not present

## 2020-08-01 ENCOUNTER — Other Ambulatory Visit (HOSPITAL_BASED_OUTPATIENT_CLINIC_OR_DEPARTMENT_OTHER): Payer: Self-pay

## 2020-08-06 DIAGNOSIS — M9903 Segmental and somatic dysfunction of lumbar region: Secondary | ICD-10-CM | POA: Diagnosis not present

## 2020-08-06 DIAGNOSIS — M5136 Other intervertebral disc degeneration, lumbar region: Secondary | ICD-10-CM | POA: Diagnosis not present

## 2020-08-06 DIAGNOSIS — M9905 Segmental and somatic dysfunction of pelvic region: Secondary | ICD-10-CM | POA: Diagnosis not present

## 2020-08-06 DIAGNOSIS — M9904 Segmental and somatic dysfunction of sacral region: Secondary | ICD-10-CM | POA: Diagnosis not present

## 2020-08-06 MED FILL — ESTRADIOL-NORETHINDRONE ACE: 1-0.5 | 84 days supply | Qty: 84 | Fill #1

## 2020-08-20 DIAGNOSIS — M5136 Other intervertebral disc degeneration, lumbar region: Secondary | ICD-10-CM | POA: Diagnosis not present

## 2020-08-20 DIAGNOSIS — M9904 Segmental and somatic dysfunction of sacral region: Secondary | ICD-10-CM | POA: Diagnosis not present

## 2020-08-20 DIAGNOSIS — M9903 Segmental and somatic dysfunction of lumbar region: Secondary | ICD-10-CM | POA: Diagnosis not present

## 2020-08-20 DIAGNOSIS — M9905 Segmental and somatic dysfunction of pelvic region: Secondary | ICD-10-CM | POA: Diagnosis not present

## 2020-09-04 DIAGNOSIS — M9904 Segmental and somatic dysfunction of sacral region: Secondary | ICD-10-CM | POA: Diagnosis not present

## 2020-09-04 DIAGNOSIS — M5136 Other intervertebral disc degeneration, lumbar region: Secondary | ICD-10-CM | POA: Diagnosis not present

## 2020-09-04 DIAGNOSIS — M9905 Segmental and somatic dysfunction of pelvic region: Secondary | ICD-10-CM | POA: Diagnosis not present

## 2020-09-04 DIAGNOSIS — M9903 Segmental and somatic dysfunction of lumbar region: Secondary | ICD-10-CM | POA: Diagnosis not present

## 2020-09-11 DIAGNOSIS — M9904 Segmental and somatic dysfunction of sacral region: Secondary | ICD-10-CM | POA: Diagnosis not present

## 2020-09-11 DIAGNOSIS — M5136 Other intervertebral disc degeneration, lumbar region: Secondary | ICD-10-CM | POA: Diagnosis not present

## 2020-09-11 DIAGNOSIS — M9905 Segmental and somatic dysfunction of pelvic region: Secondary | ICD-10-CM | POA: Diagnosis not present

## 2020-09-11 DIAGNOSIS — M9903 Segmental and somatic dysfunction of lumbar region: Secondary | ICD-10-CM | POA: Diagnosis not present

## 2020-09-18 DIAGNOSIS — M5136 Other intervertebral disc degeneration, lumbar region: Secondary | ICD-10-CM | POA: Diagnosis not present

## 2020-09-18 DIAGNOSIS — M9903 Segmental and somatic dysfunction of lumbar region: Secondary | ICD-10-CM | POA: Diagnosis not present

## 2020-09-18 DIAGNOSIS — M9905 Segmental and somatic dysfunction of pelvic region: Secondary | ICD-10-CM | POA: Diagnosis not present

## 2020-09-18 DIAGNOSIS — M9904 Segmental and somatic dysfunction of sacral region: Secondary | ICD-10-CM | POA: Diagnosis not present

## 2020-09-20 ENCOUNTER — Other Ambulatory Visit (HOSPITAL_COMMUNITY): Payer: Self-pay

## 2020-09-23 ENCOUNTER — Other Ambulatory Visit (HOSPITAL_COMMUNITY): Payer: Self-pay

## 2020-09-24 ENCOUNTER — Other Ambulatory Visit (HOSPITAL_COMMUNITY): Payer: Self-pay

## 2020-09-24 DIAGNOSIS — M9905 Segmental and somatic dysfunction of pelvic region: Secondary | ICD-10-CM | POA: Diagnosis not present

## 2020-09-24 DIAGNOSIS — M5136 Other intervertebral disc degeneration, lumbar region: Secondary | ICD-10-CM | POA: Diagnosis not present

## 2020-09-24 DIAGNOSIS — M9904 Segmental and somatic dysfunction of sacral region: Secondary | ICD-10-CM | POA: Diagnosis not present

## 2020-09-24 DIAGNOSIS — M9903 Segmental and somatic dysfunction of lumbar region: Secondary | ICD-10-CM | POA: Diagnosis not present

## 2020-09-24 MED ORDER — LEVOTHYROXINE SODIUM 100 MCG PO TABS
100.0000 ug | ORAL_TABLET | Freq: Every morning | ORAL | 3 refills | Status: DC
  Filled 2020-09-24: qty 90, 90d supply, fill #0
  Filled 2020-12-10: qty 90, 90d supply, fill #1
  Filled 2021-03-21: qty 90, 90d supply, fill #2
  Filled 2021-06-12 – 2021-06-25 (×3): qty 90, 90d supply, fill #3

## 2020-10-16 DIAGNOSIS — L738 Other specified follicular disorders: Secondary | ICD-10-CM | POA: Diagnosis not present

## 2020-10-16 DIAGNOSIS — D485 Neoplasm of uncertain behavior of skin: Secondary | ICD-10-CM | POA: Diagnosis not present

## 2020-10-16 DIAGNOSIS — D2272 Melanocytic nevi of left lower limb, including hip: Secondary | ICD-10-CM | POA: Diagnosis not present

## 2020-10-16 DIAGNOSIS — D1801 Hemangioma of skin and subcutaneous tissue: Secondary | ICD-10-CM | POA: Diagnosis not present

## 2020-10-16 DIAGNOSIS — L821 Other seborrheic keratosis: Secondary | ICD-10-CM | POA: Diagnosis not present

## 2020-10-16 DIAGNOSIS — L430 Hypertrophic lichen planus: Secondary | ICD-10-CM | POA: Diagnosis not present

## 2020-10-17 ENCOUNTER — Other Ambulatory Visit (HOSPITAL_COMMUNITY): Payer: Self-pay

## 2020-10-17 MED FILL — Estradiol & Norethindrone Acetate Tab 1-0.5 MG: ORAL | 84 days supply | Qty: 84 | Fill #0 | Status: AC

## 2020-10-22 DIAGNOSIS — M9904 Segmental and somatic dysfunction of sacral region: Secondary | ICD-10-CM | POA: Diagnosis not present

## 2020-10-22 DIAGNOSIS — M9903 Segmental and somatic dysfunction of lumbar region: Secondary | ICD-10-CM | POA: Diagnosis not present

## 2020-10-22 DIAGNOSIS — M5136 Other intervertebral disc degeneration, lumbar region: Secondary | ICD-10-CM | POA: Diagnosis not present

## 2020-10-22 DIAGNOSIS — M9905 Segmental and somatic dysfunction of pelvic region: Secondary | ICD-10-CM | POA: Diagnosis not present

## 2020-11-07 DIAGNOSIS — M5136 Other intervertebral disc degeneration, lumbar region: Secondary | ICD-10-CM | POA: Diagnosis not present

## 2020-11-07 DIAGNOSIS — M9904 Segmental and somatic dysfunction of sacral region: Secondary | ICD-10-CM | POA: Diagnosis not present

## 2020-11-07 DIAGNOSIS — M9905 Segmental and somatic dysfunction of pelvic region: Secondary | ICD-10-CM | POA: Diagnosis not present

## 2020-11-07 DIAGNOSIS — M9903 Segmental and somatic dysfunction of lumbar region: Secondary | ICD-10-CM | POA: Diagnosis not present

## 2020-11-14 DIAGNOSIS — M9904 Segmental and somatic dysfunction of sacral region: Secondary | ICD-10-CM | POA: Diagnosis not present

## 2020-11-14 DIAGNOSIS — M9905 Segmental and somatic dysfunction of pelvic region: Secondary | ICD-10-CM | POA: Diagnosis not present

## 2020-11-14 DIAGNOSIS — M5136 Other intervertebral disc degeneration, lumbar region: Secondary | ICD-10-CM | POA: Diagnosis not present

## 2020-11-14 DIAGNOSIS — M9903 Segmental and somatic dysfunction of lumbar region: Secondary | ICD-10-CM | POA: Diagnosis not present

## 2020-11-20 DIAGNOSIS — M9904 Segmental and somatic dysfunction of sacral region: Secondary | ICD-10-CM | POA: Diagnosis not present

## 2020-11-20 DIAGNOSIS — M5136 Other intervertebral disc degeneration, lumbar region: Secondary | ICD-10-CM | POA: Diagnosis not present

## 2020-11-20 DIAGNOSIS — M9905 Segmental and somatic dysfunction of pelvic region: Secondary | ICD-10-CM | POA: Diagnosis not present

## 2020-11-20 DIAGNOSIS — M9903 Segmental and somatic dysfunction of lumbar region: Secondary | ICD-10-CM | POA: Diagnosis not present

## 2020-11-27 DIAGNOSIS — M9903 Segmental and somatic dysfunction of lumbar region: Secondary | ICD-10-CM | POA: Diagnosis not present

## 2020-11-27 DIAGNOSIS — M9905 Segmental and somatic dysfunction of pelvic region: Secondary | ICD-10-CM | POA: Diagnosis not present

## 2020-11-27 DIAGNOSIS — M9904 Segmental and somatic dysfunction of sacral region: Secondary | ICD-10-CM | POA: Diagnosis not present

## 2020-11-27 DIAGNOSIS — M5136 Other intervertebral disc degeneration, lumbar region: Secondary | ICD-10-CM | POA: Diagnosis not present

## 2020-12-03 ENCOUNTER — Ambulatory Visit
Admission: RE | Admit: 2020-12-03 | Discharge: 2020-12-03 | Disposition: A | Discharge status: Home / Self Care | Payer: PPO | Source: Ambulatory Visit | Attending: Family Medicine | Admitting: Family Medicine

## 2020-12-03 ENCOUNTER — Other Ambulatory Visit: Payer: Self-pay

## 2020-12-03 DIAGNOSIS — M5136 Other intervertebral disc degeneration, lumbar region: Secondary | ICD-10-CM | POA: Diagnosis not present

## 2020-12-03 DIAGNOSIS — M8589 Other specified disorders of bone density and structure, multiple sites: Secondary | ICD-10-CM | POA: Diagnosis not present

## 2020-12-03 DIAGNOSIS — E2839 Other primary ovarian failure: Secondary | ICD-10-CM

## 2020-12-03 DIAGNOSIS — M9904 Segmental and somatic dysfunction of sacral region: Secondary | ICD-10-CM | POA: Diagnosis not present

## 2020-12-03 DIAGNOSIS — Z780 Asymptomatic menopausal state: Secondary | ICD-10-CM | POA: Diagnosis not present

## 2020-12-03 DIAGNOSIS — M9905 Segmental and somatic dysfunction of pelvic region: Secondary | ICD-10-CM | POA: Diagnosis not present

## 2020-12-03 DIAGNOSIS — M9903 Segmental and somatic dysfunction of lumbar region: Secondary | ICD-10-CM | POA: Diagnosis not present

## 2020-12-10 ENCOUNTER — Other Ambulatory Visit (HOSPITAL_COMMUNITY): Payer: Self-pay

## 2020-12-26 DIAGNOSIS — M5431 Sciatica, right side: Secondary | ICD-10-CM | POA: Diagnosis not present

## 2021-01-06 ENCOUNTER — Other Ambulatory Visit: Payer: Self-pay | Admitting: Family Medicine

## 2021-01-06 DIAGNOSIS — M5431 Sciatica, right side: Secondary | ICD-10-CM

## 2021-01-10 ENCOUNTER — Other Ambulatory Visit (HOSPITAL_COMMUNITY): Payer: Self-pay

## 2021-01-10 MED FILL — Estradiol & Norethindrone Acetate Tab 1-0.5 MG: ORAL | 84 days supply | Qty: 84 | Fill #1 | Status: AC

## 2021-01-21 DIAGNOSIS — M9903 Segmental and somatic dysfunction of lumbar region: Secondary | ICD-10-CM | POA: Diagnosis not present

## 2021-01-21 DIAGNOSIS — M9904 Segmental and somatic dysfunction of sacral region: Secondary | ICD-10-CM | POA: Diagnosis not present

## 2021-01-21 DIAGNOSIS — M5136 Other intervertebral disc degeneration, lumbar region: Secondary | ICD-10-CM | POA: Diagnosis not present

## 2021-01-21 DIAGNOSIS — M9905 Segmental and somatic dysfunction of pelvic region: Secondary | ICD-10-CM | POA: Diagnosis not present

## 2021-01-31 ENCOUNTER — Ambulatory Visit
Admission: RE | Admit: 2021-01-31 | Discharge: 2021-01-31 | Disposition: A | Discharge status: Home / Self Care | Payer: PPO | Source: Ambulatory Visit | Attending: Family Medicine | Admitting: Family Medicine

## 2021-01-31 ENCOUNTER — Other Ambulatory Visit: Payer: Self-pay

## 2021-01-31 DIAGNOSIS — M5431 Sciatica, right side: Secondary | ICD-10-CM

## 2021-01-31 DIAGNOSIS — M545 Low back pain, unspecified: Secondary | ICD-10-CM | POA: Diagnosis not present

## 2021-01-31 DIAGNOSIS — M48061 Spinal stenosis, lumbar region without neurogenic claudication: Secondary | ICD-10-CM | POA: Diagnosis not present

## 2021-02-12 ENCOUNTER — Other Ambulatory Visit: Payer: Self-pay | Admitting: Obstetrics and Gynecology

## 2021-02-12 DIAGNOSIS — Z1231 Encounter for screening mammogram for malignant neoplasm of breast: Secondary | ICD-10-CM

## 2021-03-04 ENCOUNTER — Encounter: Payer: Self-pay | Admitting: Physical Therapy

## 2021-03-04 ENCOUNTER — Ambulatory Visit: Payer: PPO | Attending: Family Medicine | Admitting: Physical Therapy

## 2021-03-04 ENCOUNTER — Other Ambulatory Visit: Payer: Self-pay

## 2021-03-04 DIAGNOSIS — M5431 Sciatica, right side: Secondary | ICD-10-CM | POA: Insufficient documentation

## 2021-03-04 DIAGNOSIS — M6281 Muscle weakness (generalized): Secondary | ICD-10-CM | POA: Diagnosis not present

## 2021-03-04 DIAGNOSIS — M5416 Radiculopathy, lumbar region: Secondary | ICD-10-CM | POA: Insufficient documentation
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
## 2021-03-06 DIAGNOSIS — M4316 Spondylolisthesis, lumbar region: Secondary | ICD-10-CM | POA: Diagnosis not present

## 2021-03-11 ENCOUNTER — Encounter: Payer: Self-pay | Admitting: Physical Therapy

## 2021-03-11 ENCOUNTER — Other Ambulatory Visit: Payer: Self-pay

## 2021-03-11 ENCOUNTER — Ambulatory Visit: Payer: PPO | Attending: Family Medicine | Admitting: Physical Therapy

## 2021-03-11 DIAGNOSIS — M5431 Sciatica, right side: Secondary | ICD-10-CM | POA: Insufficient documentation

## 2021-03-11 DIAGNOSIS — M6281 Muscle weakness (generalized): Secondary | ICD-10-CM | POA: Diagnosis not present

## 2021-03-11 DIAGNOSIS — M5416 Radiculopathy, lumbar region: Secondary | ICD-10-CM | POA: Diagnosis not present

## 2021-03-11 NOTE — Patient Instructions (Addendum)
Access Code: [email or web address] URL: [email or web address] Date: 03/11/2021 Prepared by: Venetia Night Diona Peregoy  Exercises Supine Hip Internal and External Rotation - 1 x daily - 7 x weekly - 1 sets - 20 reps Standing Hip Flexor Stretch - 1 x daily - 7 x weekly - 1 sets - 2 reps - 20 hold  Trigger Point Dry Needling  What is Trigger Point Dry Needling (DN)? DN is a physical therapy technique used to treat muscle pain and dysfunction. Specifically, DN helps deactivate muscle trigger points (muscle knots).  A thin filiform needle is used to penetrate the skin and stimulate the underlying trigger point. The goal is for a local twitch response (LTR) to occur and for the trigger point to relax. No medication of any kind is injected during the procedure.   What Does Trigger Point Dry Needling Feel Like?  The procedure feels different for each individual patient. Some patients report that they do not actually feel the needle enter the skin and overall the process is not painful. Very mild bleeding may occur. However, many patients feel a deep cramping in the muscle in which the needle was inserted. This is the local twitch response.   How Will I feel after the treatment? Soreness is normal, and the onset of soreness may not occur for a few hours. Typically this soreness does not last longer than two days.  Bruising is uncommon, however; ice can be used to decrease any possible bruising.  In rare cases feeling tired or nauseous after the treatment is normal. In addition, your symptoms may get worse before they get better, this period will typically not last longer than 24 hours.   What Can I do After My Treatment? Increase your hydration by drinking more water for the next 24 hours. You may place ice or heat on the areas treated that have become sore, however, do not use heat on inflamed or bruised areas. Heat often brings more relief post needling. You can continue your regular activities, but  vigorous activity is not recommended initially after the treatment for 24 hours. DN is best combined with other physical therapy such as strengthening, stretching, and other therapies.  Benedict  [address] [place] [address].  [phone]

## 2021-03-11 NOTE — Therapy (Signed)
[facility] @ [place], [place], [address] Phone: [phone]   Fax:  [phone]  Physical Therapy Treatment  Patient Details  Name: Renee Montgomery MRN: [id number] Date of Birth: 1951/04/20 Referring Provider (PT): Dr. Lujean Amel   Encounter Date: 03/11/2021   PT End of Session - 03/11/21 1339     Visit Number 2    Date for PT Re-Evaluation 04/29/21    Authorization Type Medicare 10th visit progress note    PT Start Time 0932    PT Stop Time 1020    PT Time Calculation (min) 48 min    Activity Tolerance Patient tolerated treatment well    Behavior During Therapy [facility] for tasks assessed/performed             Past Medical History:  Diagnosis Date   Anxiety    Thyroid disease    Hypo    Past Surgical History:  Procedure Laterality Date   AUGMENTATION MAMMAPLASTY Bilateral    BREAST ENHANCEMENT SURGERY  2003   FOREARM FRACTURE SURGERY  5956   left   UMBILICAL HERNIA REPAIR  1990    There were no vitals filed for this visit.   Subjective Assessment - 03/11/21 0938     Subjective I have a big lump in my Rt buttock.  I am nervous about the DN.  I had a bad day yesterday but I know I overdid it in the yard the day before.  I always overdo when I'm feeling good.    Pertinent History osteopenia    Limitations Walking;House hold activities    How long can you sit comfortably? 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 Yes    Pain Score 2     Pain Location Buttocks    Pain Orientation Right;Lateral;Posterior    Pain Type Chronic pain    Pain Radiating Towards lateral hip to ankle /foot    Pain Onset More than a month ago    Aggravating Factors  walking the dog, late in day, lifting    Pain Relieving Factors  sitting, morning, supine                               [facility] Adult PT Treatment/Exercise - 03/11/21 0001       Exercises   Exercises Knee/Hip;Lumbar      Lumbar Exercises: Stretches   Other Lumbar Stretch Exercise bil hip IR/ER windshield wiper x 20 reps (added to HEP)      Knee/Hip Exercises: Stretches   Hip Flexor Stretch Both;1 rep;20 seconds    Hip Flexor Stretch Limitations in doorway, standing      Manual Therapy   Manual Therapy Soft tissue mobilization;Manual Traction    Soft tissue mobilization lumbar and Rt hip after DN    Manual Traction prone sacral distraction Gr II/III              Trigger Point Dry Needling - 03/11/21 0001     Consent Given? Yes    Education Handout Provided Yes    Muscles Treated Back/Hip Lumbar multifidi;Gluteus medius;Gluteus minimus    Other Dry Needling bil multifidi L4-S1, Rt hip only    Gluteus  Minimus Response Twitch response elicited;Palpable increased muscle length    Gluteus Medius Response Twitch response elicited;Palpable increased muscle length    Lumbar multifidi Response Twitch response elicited;Palpable increased muscle length   most notable at Lt L4/5                  PT Education - 03/11/21 1020     Education Details Access Code: [email or web address]    Person(s) Educated Patient    Methods Explanation;Handout    Comprehension Verbalized understanding;Returned demonstration              PT Short Term Goals - 03/04/21 2022       PT SHORT TERM GOAL #1   Title The patient will demonstrate basic self care and ex's to relieve LE symptoms    Time 4    Period Weeks    Status New    Target Date 04/01/21      PT SHORT TERM GOAL #2   Title The patient will report a 30% reduction in right LE symptoms with walking medium distances    Time 4    Period Weeks    Status New      PT SHORT TERM GOAL #3   Title The patient will have improved bil lumbo/pelvic/hip strength to grossly 4+/5 needed for  standing to cook and ascend steps    Time 4    Period Weeks    Status New               PT Long Term Goals - 03/04/21 2028       PT LONG TERM GOAL #1   Title The patient will be independent with safe self progression of HEP    Time 8    Period Weeks    Status New    Target Date 04/29/21      PT LONG TERM GOAL #2   Title The patient will report a 60% improvement in right LE symptoms with walking    Time 8    Period Weeks    Status New      PT LONG TERM GOAL #3   Title The patient will be able to walk her dog medium distances    Time 8    Period Weeks    Status New      PT LONG TERM GOAL #4   Title The patient will have 4+/5 to 5-/5 lumbo pelvic hip strength needed for lifting medium weight objects    Time 8    Period Weeks    Status New      PT LONG TERM GOAL #5   Title FOTO score improved to 62% indicating improved function with less pain    Time 8    Period Weeks    Status New                   Plan - 03/11/21 1340     Clinical Impression Statement Pt arrived with low grade pain given AM appointment and pain being best in AM.  She was nervous about DN but wanted to try it.  She had twitch response at Lt L4/5 multifidi (non-painful side) only across bil DN to L4-S1.  She had signif twitch and release in Rt hip and requested to save piriformis for another day.  She had a slight sympathetic response with sweating during Rt hip DN but otherwise tolerated well with careful monitoring.  PT gave DN after care instructions and HEP for hip windshield  wipers and standing hip flexor stretch in doorway.  Pt reported "I can feel the release happening" end of session.  Continue along POC with ongoing assessment of response to treatment.    Comorbidities osteopenia    Rehab Potential Good    PT Frequency 2x / week    PT Duration 8 weeks    PT Treatment/Interventions ADLs/Self Care Home Management;Aquatic Therapy;Cryotherapy;Electrical  Stimulation;Ultrasound;Traction;Moist Heat;Iontophoresis 4mg /ml Dexamethasone;Neuromuscular re-education;Therapeutic exercise;Therapeutic activities;Patient/family education;Manual techniques;Dry needling;Taping;Spinal Manipulations    PT Next Visit Plan f/u on DN #1 to Rt hip and lumbar multifidi, review HEP, add quadruped rocking for opening hip mobility, intro core    PT Home Exercise Plan Access Code: [email or web address]    [email or web address] and Agree with Plan of Care Patient             Patient will benefit from skilled therapeutic intervention in order to improve the following deficits and impairments:     Visit Diagnosis: Radiculopathy, lumbar region  Sciatica, right side  Muscle weakness (generalized)     Problem List Patient Active Problem List   Diagnosis Date Noted   SHOULDER STRAIN, RIGHT 03/20/2010    Baruch Merl, PT 03/11/21 1:45 PM   [facility] @ [place], [place], [address] Phone: [phone]   Fax:  [phone]  Name: Renee Montgomery MRN: [id number] Date of Birth: 15-Aug-1950

## 2021-03-13 ENCOUNTER — Other Ambulatory Visit: Payer: Self-pay

## 2021-03-13 ENCOUNTER — Ambulatory Visit: Payer: PPO | Admitting: Physical Therapy

## 2021-03-13 DIAGNOSIS — M5431 Sciatica, right side: Secondary | ICD-10-CM

## 2021-03-13 DIAGNOSIS — M6281 Muscle weakness (generalized): Secondary | ICD-10-CM

## 2021-03-13 DIAGNOSIS — M5416 Radiculopathy, lumbar region: Secondary | ICD-10-CM

## 2021-03-13 NOTE — Patient Instructions (Signed)
Access Code: [email or web address] URL: [email or web address] Date: 03/13/2021 Prepared by: Ruben Im  Exercises Supine Hip Internal and External Rotation - 1 x daily - 7 x weekly - 1 sets - 20 reps Standing Hip Flexor Stretch - 1 x daily - 7 x weekly - 1 sets - 2 reps - 20 hold Quadruped Rocking Backward - 1 x daily - 7 x weekly - 1 sets - 10 reps Supine Bent Leg Lift with Knee Extension - 1 x daily - 7 x weekly - 1 sets - 10 reps Clamshell with Resistance - 1 x daily - 7 x weekly - 1 sets - 10 reps Hooklying Clamshell with Resistance - 1 x daily - 7 x weekly - 1 sets - 10 reps Prone Hip Extension - One Pillow - 1 x daily - 7 x weekly - 1 sets - 5 reps Prone Scapular Retraction Arms at Side - 1 x daily - 7 x weekly - 1 sets - 5 reps

## 2021-03-13 NOTE — Therapy (Signed)
[facility] @ [place], [place], [address] Phone: [phone]   Fax:  [phone]  Physical Therapy Treatment  Patient Details  Name: Renee Montgomery MRN: [id number] Date of Birth: 08/24/1950 Referring Provider (PT): Dr. Lujean Amel   Encounter Date: 03/13/2021   PT End of Session - 03/13/21 1749     Visit Number 3    Date for PT Re-Evaluation 04/29/21    Authorization Type Medicare 10th visit progress note    PT Start Time 1234    PT Stop Time 1325    PT Time Calculation (min) 51 min    Activity Tolerance Patient tolerated treatment well             Past Medical History:  Diagnosis Date   Anxiety    Thyroid disease    Hypo    Past Surgical History:  Procedure Laterality Date   AUGMENTATION MAMMAPLASTY Bilateral    BREAST ENHANCEMENT SURGERY  2003   FOREARM FRACTURE SURGERY  0175   left   UMBILICAL HERNIA REPAIR  1990    There were no vitals filed for this visit.   Subjective Assessment - 03/13/21 1237     Subjective The DN was more intense than I thought.  I felt awesome afterwards and walked the dog.  I had intense pain that night in hip and lower leg and lower back.  Yesterday I had a lot of sciatica and didn't walk the dog.  Sat a lot.  Today I'm really good.  Still "wad in the butt" but not as tight as I did.    Pertinent History osteopenia; saw Dr. 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 Yes    Pain Orientation Right    Pain Type Chronic pain                               [facility] Adult PT Treatment/Exercise - 03/13/21 0001       Lumbar Exercises: Stretches   Other Lumbar Stretch Exercise review of 2nd step hip flexor stretching      Lumbar Exercises:  Supine   Ab Set 5 reps    Bent Knee Raise 10 reps    Bent Knee Raise Limitations with arms at 90 degrees    Other Supine Lumbar Exercises attempted SLR but produced LBP so discontinued    Other Supine Lumbar Exercises supine left clam red band 10x      Lumbar Exercises: Sidelying   Clam Right;10 reps   with red band;  painful on left  so discontinued     Lumbar Exercises: Prone   Other Prone Lumbar Exercises over 1 pillow: multifidi press down (keeping glutes relaxed)    Other Prone Lumbar Exercises over 1 pillow hip extension to neutral 2x 5 right/left   produced LE tingling but resolves in < 30 sec     Lumbar Exercises: Quadruped   Other Quadruped Lumbar Exercises rocking with crossed ankles and angled bias                     PT Education - 03/13/21 1748     Education Details supine abdominal brace, sidelying  clam on right, supine clam left; prone multifidi ex's; quadruped rocking    Person(s) Educated Patient    Methods Explanation;Demonstration;Handout    Comprehension Verbalized understanding;Returned demonstration              PT Short Term Goals - 03/04/21 2022       PT SHORT TERM GOAL #1   Title The patient will demonstrate basic self care and ex's to relieve LE symptoms    Time 4    Period Weeks    Status New    Target Date 04/01/21      PT SHORT TERM GOAL #2   Title The patient will report a 30% reduction in right LE symptoms with walking medium distances    Time 4    Period Weeks    Status New      PT SHORT TERM GOAL #3   Title The patient will have improved bil lumbo/pelvic/hip strength to grossly 4+/5 needed for standing to cook and ascend steps    Time 4    Period Weeks    Status New               PT Long Term Goals - 03/04/21 2028       PT LONG TERM GOAL #1   Title The patient will be independent with safe self progression of HEP    Time 8    Period Weeks    Status New    Target Date 04/29/21      PT LONG TERM GOAL #2    Title The patient will report a 60% improvement in right LE symptoms with walking    Time 8    Period Weeks    Status New      PT LONG TERM GOAL #3   Title The patient will be able to walk her dog medium distances    Time 8    Period Weeks    Status New      PT LONG TERM GOAL #4   Title The patient will have 4+/5 to 5-/5 lumbo pelvic hip strength needed for lifting medium weight objects    Time 8    Period Weeks    Status New      PT LONG TERM GOAL #5   Title FOTO score improved to 62% indicating improved function with less pain    Time 8    Period Weeks    Status New                   Plan - 03/13/21 1248     Clinical Impression Statement The patient had a positive response to quadruped rocking with bias side to side.  She has lower abdominal strength to perform bent knee lifts and lowers but lacks stability for straight leg lowering.  Initiated glute medius strengthening in sidelying for right however painful and weak on left therefore modified to perform in supine instead.  Also instructed in lumbar multifidi activation with added hip extension which did produce peripheral symptoms but did subside very quickly.  She may benefit from treatment to piriformis for possible compression to sciatic nerve.  Therapist closely monitoring symptoms throughout treatment session.    Examination-Participation Restrictions Meal Prep;Community Activity;Other    Rehab Potential Good    PT Frequency 2x / week    PT Duration 8 weeks    PT Treatment/Interventions ADLs/Self Care Home Management;Aquatic Therapy;Cryotherapy;Electrical Stimulation;Ultrasound;Traction;Moist Heat;Iontophoresis 4mg /ml Dexamethasone;Neuromuscular re-education;Therapeutic exercise;Therapeutic activities;Patient/family education;Manual techniques;Dry needling;Taping;Spinal Manipulations  PT Next Visit Plan DN #2 to piriformis and gluteals, possible DN with added ES to lumbar region (watch sympathetic response)   review HEP    PT Home Exercise Plan Access Code: [email or web address]             Patient will benefit from skilled therapeutic intervention in order to improve the following deficits and impairments:  Difficulty walking, Pain, Increased fascial restricitons, Impaired perceived functional ability, Decreased activity tolerance, Decreased strength  Visit Diagnosis: Radiculopathy, lumbar region  Sciatica, right side  Muscle weakness (generalized)     Problem List Patient Active Problem List   Diagnosis Date Noted   [place], RIGHT 03/20/2010   Ruben Im, PT 03/13/21 5:57 PM Phone: [phone] Fax: [phone]  Alvera Singh, PT 03/13/2021, 5:57 PM  [facility] @ [place], [place], [address] Phone: [phone]   Fax:  [phone]  Name: SHELVIE SALSBERRY MRN: [id number] Date of Birth: February 14, 1951

## 2021-03-18 ENCOUNTER — Ambulatory Visit: Payer: PPO | Admitting: Physical Therapy

## 2021-03-18 ENCOUNTER — Other Ambulatory Visit: Payer: Self-pay

## 2021-03-18 DIAGNOSIS — M6281 Muscle weakness (generalized): Secondary | ICD-10-CM

## 2021-03-18 DIAGNOSIS — M5416 Radiculopathy, lumbar region: Secondary | ICD-10-CM

## 2021-03-18 DIAGNOSIS — M5431 Sciatica, right side: Secondary | ICD-10-CM

## 2021-03-18 NOTE — Therapy (Signed)
[facility] @ [place], [place], [address] Phone: [phone]   Fax:  [phone]  Physical Therapy Treatment  Patient Details  Name: Renee Montgomery MRN: [id number] Date of Birth: February 26, 1951 Referring Provider (PT): Dr. Lujean Amel   Encounter Date: 03/18/2021   PT End of Session - 03/18/21 1741     Visit Number 4    Date for PT Re-Evaluation 04/29/21    Authorization Type Medicare 10th visit progress note    PT Start Time 0800    PT Stop Time 0844    PT Time Calculation (min) 44 min    Activity Tolerance Patient tolerated treatment well             Past Medical History:  Diagnosis Date   Anxiety    Thyroid disease    Hypo    Past Surgical History:  Procedure Laterality Date   AUGMENTATION MAMMAPLASTY Bilateral    BREAST ENHANCEMENT SURGERY  2003   FOREARM FRACTURE SURGERY  4431   left   UMBILICAL HERNIA REPAIR  1990    There were no vitals filed for this visit.   Subjective Assessment - 03/18/21 0802     Subjective I'm usually good in the mornings.  Busy over the weekend so I went crazy working around the house.  I was in a lot of pain last night in lower back and right buttock.  Also right lateral lower leg.  Feeling lower leg this morning.  I was surprised the ex's made me sore.    Pertinent History osteopenia; saw Dr. Saintclair Halsted and will return in early Dec    How long can you stand comfortably? 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 Yes    Pain Score 2     Pain Location Back    Pain Orientation Right    Pain Radiating Towards hip to lower leg                               [facility] Adult PT Treatment/Exercise - 03/18/21 0001       Lumbar Exercises: Supine    Ab Set 5 reps    Bent Knee Raise 10 reps    Bent Knee Raise Limitations with arms at 90 degrees    Other Supine Lumbar Exercises single component neural glide: foot PF, inversion, knee flex/extension 5 reps each      Knee/Hip Exercises: Stretches   Hip Flexor Stretch Both;1 rep;20 seconds    Hip Flexor Stretch Limitations 2nd step      Knee/Hip Exercises: Standing   Other Standing Knee Exercises prior to treatment very wobbly SLS on right; post treatment 10 sec SLS on right      Electrical Stimulation   Electrical Stimulation Location bil lumbar multifidi    Electrical Stimulation Action with DN    Electrical Stimulation Parameters 1.5 ma 8 min    Electrical Stimulation Goals Pain      Manual Therapy   Soft tissue mobilization lumbar and Rt gluteals              Trigger Point Dry Needling - 03/18/21 0001     Consent Given? Yes  Muscles Treated Back/Hip Piriformis    Electrical Stimulation Performed with Dry Needling Yes   bil lumbar multifidi   Gluteus Minimus Response Twitch response elicited;Palpable increased muscle length    Gluteus Medius Response Twitch response elicited;Palpable increased muscle length    Piriformis Response Palpable increased muscle length    Lumbar multifidi Response Palpable increased muscle length                     PT Short Term Goals - 03/04/21 2022       PT SHORT TERM GOAL #1   Title The patient will demonstrate basic self care and ex's to relieve LE symptoms    Time 4    Period Weeks    Status New    Target Date 04/01/21      PT SHORT TERM GOAL #2   Title The patient will report a 30% reduction in right LE symptoms with walking medium distances    Time 4    Period Weeks    Status New      PT SHORT TERM GOAL #3   Title The patient will have improved bil lumbo/pelvic/hip strength to grossly 4+/5 needed for standing to cook and ascend steps    Time 4    Period Weeks    Status New               PT Long  Term Goals - 03/04/21 2028       PT LONG TERM GOAL #1   Title The patient will be independent with safe self progression of HEP    Time 8    Period Weeks    Status New    Target Date 04/29/21      PT LONG TERM GOAL #2   Title The patient will report a 60% improvement in right LE symptoms with walking    Time 8    Period Weeks    Status New      PT LONG TERM GOAL #3   Title The patient will be able to walk her dog medium distances    Time 8    Period Weeks    Status New      PT LONG TERM GOAL #4   Title The patient will have 4+/5 to 5-/5 lumbo pelvic hip strength needed for lifting medium weight objects    Time 8    Period Weeks    Status New      PT LONG TERM GOAL #5   Title FOTO score improved to 62% indicating improved function with less pain    Time 8    Period Weeks    Status New                   Plan - 03/18/21 1741     Clinical Impression Statement Although the patient reports initial DN was "intense" she is willing to do DN #2.  Initiated DN to piriformis in addition to limited gluteals with numerous tender points present.  Add ES to DN of lumbar multifidi for longer lasting relief.   Added supine low level, single component neural flossing without symptom exacerbation.  Prior to treatment the patient was unable to single leg stand on right, at the end of session she is able to single leg stand 10 sec.  Therapist closely monitoring response and modifying treatment accordingly.    Personal Factors and Comorbidities Comorbidity 1;Time since onset of injury/illness/exacerbation    Comorbidities osteopenia  Examination-Activity Limitations Locomotion Level;Lift;Stand;Sleep    Rehab Potential Good    PT Frequency 2x / week    PT Duration 8 weeks    PT Treatment/Interventions ADLs/Self Care Home Management;Aquatic Therapy;Cryotherapy;Electrical Stimulation;Ultrasound;Traction;Moist Heat;Iontophoresis 4mg /ml Dexamethasone;Neuromuscular re-education;Therapeutic  exercise;Therapeutic activities;Patient/family education;Manual techniques;Dry needling;Taping;Spinal Manipulations    PT Next Visit Plan assess response to DN #2 to piriformis and gluteals, with added ES to lumbar region (watch sympathetic response)  review HEP; try seated neural floss    PT Home Exercise Plan Access Code: [email or web address]             Patient will benefit from skilled therapeutic intervention in order to improve the following deficits and impairments:  Difficulty walking, Pain, Increased fascial restricitons, Impaired perceived functional ability, Decreased activity tolerance, Decreased strength  Visit Diagnosis: Radiculopathy, lumbar region  Sciatica, right side  Muscle weakness (generalized)     Problem List Patient Active Problem List   Diagnosis Date Noted   [place], RIGHT 03/20/2010   Ruben Im, PT 03/18/21 5:48 PM Phone: [phone] Fax: [phone]  Alvera Singh, PT 03/18/2021, 5:48 PM  [facility] @ [place], [place], [address] Phone: [phone]   Fax:  [phone]  Name: Renee Montgomery MRN: [id number] Date of Birth: June 06, 1950

## 2021-03-19 ENCOUNTER — Ambulatory Visit
Admission: RE | Admit: 2021-03-19 | Discharge: 2021-03-19 | Disposition: A | Discharge status: Home / Self Care | Payer: PPO | Source: Ambulatory Visit | Attending: Obstetrics and Gynecology | Admitting: Obstetrics and Gynecology

## 2021-03-19 DIAGNOSIS — Z1231 Encounter for screening mammogram for malignant neoplasm of breast: Secondary | ICD-10-CM

## 2021-03-20 ENCOUNTER — Ambulatory Visit: Payer: PPO | Admitting: Physical Therapy

## 2021-03-20 ENCOUNTER — Other Ambulatory Visit: Payer: Self-pay

## 2021-03-20 DIAGNOSIS — M5416 Radiculopathy, lumbar region: Secondary | ICD-10-CM

## 2021-03-20 DIAGNOSIS — M5431 Sciatica, right side: Secondary | ICD-10-CM

## 2021-03-20 DIAGNOSIS — M6281 Muscle weakness (generalized): Secondary | ICD-10-CM

## 2021-03-20 NOTE — Therapy (Signed)
[facility] @ [address] [place], [place], [address] Phone: [phone]   Fax:  [phone]  Physical Therapy Treatment  Patient Details  Name: Renee Montgomery MRN: [id number] Date of Birth: 01/14/1951 Referring Provider (PT): Dr. Lujean Amel   Encounter Date: 03/20/2021   PT End of Session - 03/20/21 1655     Visit Number 5    Date for PT Re-Evaluation 04/29/21    Authorization Type Medicare 10th visit progress note    PT Start Time 0930    PT Stop Time 1013    PT Time Calculation (min) 43 min    Activity Tolerance Patient tolerated treatment well             Past Medical History:  Diagnosis Date   Anxiety    Thyroid disease    Hypo    Past Surgical History:  Procedure Laterality Date   AUGMENTATION MAMMAPLASTY Bilateral    BREAST ENHANCEMENT SURGERY  2003   FOREARM FRACTURE SURGERY  2542   left   UMBILICAL HERNIA REPAIR  1990    There were no vitals filed for this visit.   Subjective Assessment - 03/20/21 0929     Subjective I had remarkable results!  I feel so much better.  I've had zero back pain.  A little ball is still in the hip.   Demonstrates single leg standing.  I did have some nausea for a little after though.    Pertinent History osteopenia; saw Dr. Saintclair Halsted and will return in early Dec    How long can you stand comfortably? 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 Yes    Pain Score 2     Pain Location Hip    Pain Orientation Right    Pain Type Chronic pain                               [facility] Adult PT Treatment/Exercise - 03/20/21 0001       Lumbar Exercises: Stretches   Other Lumbar Stretch Exercise doorway psoas 3x 5      Lumbar Exercises: Machines  for Strengthening   Other Lumbar Machine Exercise seated 15# 15x row      Lumbar Exercises: Standing   Row Strengthening    Theraband Level (Row) Level 3 (Green)    Shoulder Extension Strengthening;Both;20 reps;Theraband    Theraband Level (Shoulder Extension) Level 3 (Green)    Shoulder Extension Limitations over top of the door    Other Standing Lumbar Exercises green band anchored over top of the door lat pull downs 15x      Lumbar Exercises: Supine   Other Supine Lumbar Exercises single component neural glide: foot PF, inversion, knee flex/extension 5 reps each      Lumbar Exercises: Quadruped   Other Quadruped Lumbar Exercises rocking with crossed ankles and angled bias                     PT Education - 03/20/21 1654     Education Details progression to standing core ex's with green band;  supine neural floss single component;  doorway dynamic stretch    Person(s) Educated Patient  Methods Explanation;Demonstration;Handout    Comprehension Returned demonstration;Verbalized understanding              PT Short Term Goals - 03/04/21 2022       PT SHORT TERM GOAL #1   Title The patient will demonstrate basic self care and ex's to relieve LE symptoms    Time 4    Period Weeks    Status New    Target Date 04/01/21      PT SHORT TERM GOAL #2   Title The patient will report a 30% reduction in right LE symptoms with walking medium distances    Time 4    Period Weeks    Status New      PT SHORT TERM GOAL #3   Title The patient will have improved bil lumbo/pelvic/hip strength to grossly 4+/5 needed for standing to cook and ascend steps    Time 4    Period Weeks    Status New               PT Long Term Goals - 03/04/21 2028       PT LONG TERM GOAL #1   Title The patient will be independent with safe self progression of HEP    Time 8    Period Weeks    Status New    Target Date 04/29/21      PT LONG TERM GOAL #2   Title The patient will  report a 60% improvement in right LE symptoms with walking    Time 8    Period Weeks    Status New      PT LONG TERM GOAL #3   Title The patient will be able to walk her dog medium distances    Time 8    Period Weeks    Status New      PT LONG TERM GOAL #4   Title The patient will have 4+/5 to 5-/5 lumbo pelvic hip strength needed for lifting medium weight objects    Time 8    Period Weeks    Status New      PT LONG TERM GOAL #5   Title FOTO score improved to 62% indicating improved function with less pain    Time 8    Period Weeks    Status New                   Plan - 03/20/21 0939     Clinical Impression Statement Although the pain was very intense with DN last visit and some residual nausea for a couple of hours, she reports significant back pain relief after last session.  She also continues to have improved gluteal muscle activation and stability and is able to single leg standing on right with greater ease.   She is able to initiate core strengthening in standing with some production of peripheral tingling but this resolves within a few minutes.  Improved hip mobility noted as well.  Therapist closely monitoring symptoms--no low back pain and transient LE symptoms.    Personal Factors and Comorbidities Comorbidity 1;Time since onset of injury/illness/exacerbation    Comorbidities osteopenia    Examination-Activity Limitations Locomotion Level;Lift;Stand;Sleep    Rehab Potential Good    PT Frequency 2x / week    PT Duration 8 weeks    PT Treatment/Interventions ADLs/Self Care Home Management;Aquatic Therapy;Cryotherapy;Electrical Stimulation;Ultrasound;Traction;Moist Heat;Iontophoresis 4mg /ml Dexamethasone;Neuromuscular re-education;Therapeutic exercise;Therapeutic activities;Patient/family education;Manual techniques;Dry needling;Taping;Spinal Manipulations    PT Next Visit Plan DN #3 to  piriformis and gluteals, with added ES to lumbar region (watch sympathetic  response)  review HEP; try seated neural floss;  review standing band ex's as needed    PT Home Exercise Plan Access Code: [email or web address]             Patient will benefit from skilled therapeutic intervention in order to improve the following deficits and impairments:  Difficulty walking, Pain, Increased fascial restricitons, Impaired perceived functional ability, Decreased activity tolerance, Decreased strength  Visit Diagnosis: Radiculopathy, lumbar region  Sciatica, right side  Muscle weakness (generalized)     Problem List Patient Active Problem List   Diagnosis Date Noted   [place], RIGHT 03/20/2010   Ruben Im, PT 03/20/21 5:02 PM Phone: [phone] Fax: [phone]  Alvera Singh, PT 03/20/2021, [facility] @ [place] [place], [place], [address] Phone: [phone]   Fax:  [phone]  Name: Renee Montgomery MRN: [id number] Date of Birth: 17-Mar-1951

## 2021-03-20 NOTE — Patient Instructions (Signed)
Access Code: [email or web address] URL: [email or web address] Date: 03/20/2021 Prepared by: Ruben Im  Exercises Supine Hip Internal and External Rotation - 1 x daily - 7 x weekly - 1 sets - 20 reps Standing Hip Flexor Stretch - 1 x daily - 7 x weekly - 1 sets - 2 reps - 20 hold Quadruped Rocking Backward - 1 x daily - 7 x weekly - 1 sets - 10 reps Supine Bent Leg Lift with Knee Extension - 1 x daily - 7 x weekly - 1 sets - 10 reps Clamshell with Resistance - 1 x daily - 7 x weekly - 1 sets - 10 reps Hooklying Clamshell with Resistance - 1 x daily - 7 x weekly - 1 sets - 10 reps Prone Hip Extension - One Pillow - 1 x daily - 7 x weekly - 1 sets - 5 reps Prone Scapular Retraction Arms at Side - 1 x daily - 7 x weekly - 1 sets - 5 reps Supine Peroneal Nerve Glide - 1 x daily - 7 x weekly - 1 sets - 5 reps Supine Sciatic Nerve Glide - 1 x daily - 7 x weekly - 1 sets - 5 reps doorway hip flexor stretch - 1 x daily - 7 x weekly - 1 sets - 10 reps Standing Row with Anchored Resistance Band with PLB - 1 x daily - 7 x weekly - 1 sets - 10 reps Standing High Row with Anchored Resistance - 1 x daily - 7 x weekly - 1 sets - 10 reps Shoulder Extension with Resistance Hands Down - 1 x daily - 7 x weekly - 1 sets - 10 reps

## 2021-03-21 ENCOUNTER — Other Ambulatory Visit (HOSPITAL_COMMUNITY): Payer: Self-pay

## 2021-03-25 ENCOUNTER — Other Ambulatory Visit: Payer: Self-pay

## 2021-03-25 ENCOUNTER — Ambulatory Visit: Payer: PPO | Admitting: Physical Therapy

## 2021-03-25 DIAGNOSIS — M5431 Sciatica, right side: Secondary | ICD-10-CM

## 2021-03-25 DIAGNOSIS — M5416 Radiculopathy, lumbar region: Secondary | ICD-10-CM | POA: Diagnosis not present

## 2021-03-25 DIAGNOSIS — M6281 Muscle weakness (generalized): Secondary | ICD-10-CM

## 2021-03-25 NOTE — Therapy (Signed)
[facility] @ [address] [place], [place], [address] Phone: [phone]   Fax:  [phone]  Physical Therapy Treatment  Patient Details  Name: Renee Montgomery MRN: [id number] Date of Birth: 1950-09-16 Referring Provider (PT): Dr. Lujean Amel   Encounter Date: 03/25/2021   PT End of Session - 03/25/21 1721     Visit Number 6    Date for PT Re-Evaluation 04/29/21    Authorization Type Medicare 10th visit progress note    PT Start Time 1230    PT Stop Time 1318    PT Time Calculation (min) 48 min    Activity Tolerance Patient tolerated treatment well             Past Medical History:  Diagnosis Date   Anxiety    Thyroid disease    Hypo    Past Surgical History:  Procedure Laterality Date   AUGMENTATION MAMMAPLASTY Bilateral    BREAST ENHANCEMENT SURGERY  2003   FOREARM FRACTURE SURGERY  7253   left   UMBILICAL HERNIA REPAIR  1990    There were no vitals filed for this visit.   Subjective Assessment - 03/25/21 1234     Subjective I had a set back..felt great Thursday and Friday.  I had my line dance class on Friday and had a little sciatica that night.  Saturday and Sunday did some bending and the sciatica got horrific.  I couldn't do some of the exercises Sunday night.  I need to review that doorway exercise.    Pertinent History osteopenia; saw Dr. Saintclair Halsted and will return in early Dec    Limitations Walking;House hold activities    How long can you sit comfortably? as long as I want    How long can you stand comfortably? 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 Yes    Pain Score 5     Pain Location Buttocks    Pain Orientation Right    Pain Type Chronic pain    Pain Radiating Towards right  lower leg.                               [facility] Adult PT Treatment/Exercise - 03/25/21 0001       Lumbar Exercises: Stretches   Other Lumbar Stretch Exercise doorway psoas 3x 5      Lumbar Exercises: Standing   Other Standing Lumbar Exercises SLS right/left      Lumbar Exercises: Seated   Other Seated Lumbar Exercises seated neural floss 10x right/left      Manual Therapy   Manual therapy comments right piriformis contract/relax 3x 5 sec    Joint Mobilization right hip long axis distraction, inferiorglide, AP in internal rotation, prone PA, prone PA in internal and external rotation grade 3/4 3x 20 sec each    Soft tissue mobilization right glutes and piriformis              Trigger Point Dry Needling - 03/25/21 0001     Consent Given? Yes    Gluteus Minimus Response Twitch response elicited;Palpable increased muscle length    Gluteus Medius Response Twitch response elicited;Palpable increased muscle length  Piriformis Response Palpable increased muscle length                   PT Education - 03/25/21 1352     Education Details seated neural floss    Person(s) Educated Patient    Methods Explanation;Demonstration;Handout    Comprehension Returned demonstration;Verbalized understanding              PT Short Term Goals - 03/04/21 2022       PT SHORT TERM GOAL #1   Title The patient will demonstrate basic self care and ex's to relieve LE symptoms    Time 4    Period Weeks    Status New    Target Date 04/01/21      PT SHORT TERM GOAL #2   Title The patient will report a 30% reduction in right LE symptoms with walking medium distances    Time 4    Period Weeks    Status New      PT SHORT TERM GOAL #3   Title The patient will have improved bil lumbo/pelvic/hip strength to grossly 4+/5 needed for standing to cook and ascend steps    Time 4    Period Weeks    Status New               PT Long Term Goals - 03/04/21 2028        PT LONG TERM GOAL #1   Title The patient will be independent with safe self progression of HEP    Time 8    Period Weeks    Status New    Target Date 04/29/21      PT LONG TERM GOAL #2   Title The patient will report a 60% improvement in right LE symptoms with walking    Time 8    Period Weeks    Status New      PT LONG TERM GOAL #3   Title The patient will be able to walk her dog medium distances    Time 8    Period Weeks    Status New      PT LONG TERM GOAL #4   Title The patient will have 4+/5 to 5-/5 lumbo pelvic hip strength needed for lifting medium weight objects    Time 8    Period Weeks    Status New      PT LONG TERM GOAL #5   Title FOTO score improved to 62% indicating improved function with less pain    Time 8    Period Weeks    Status New                   Plan - 03/25/21 1721     Personal Factors and Comorbidities Comorbidity 1;Time since onset of injury/illness/exacerbation    Comorbidities osteopenia    Examination-Activity Limitations Locomotion Level;Lift;Stand;Sleep    Rehab Potential Good    PT Frequency 2x / week    PT Duration 8 weeks    PT Treatment/Interventions ADLs/Self Care Home Management;Aquatic Therapy;Cryotherapy;Electrical Stimulation;Ultrasound;Traction;Moist Heat;Iontophoresis 4mg /ml Dexamethasone;Neuromuscular re-education;Therapeutic exercise;Therapeutic activities;Patient/family education;Manual techniques;Dry needling;Taping;Spinal Manipulations    PT Next Visit Plan assess response to DN and right hip mobs; DN/ES to lumbar multifidi, possible traction; left hip mobs; progress ex's as tolerated    PT Home Exercise Plan Access Code: [email or web address]             Patient will benefit from skilled therapeutic intervention in order to improve the following deficits  and impairments:  Difficulty walking, Pain, Increased fascial restricitons, Impaired perceived functional ability, Decreased activity tolerance, Decreased  strength  Visit Diagnosis: Radiculopathy, lumbar region  Sciatica, right side  Muscle weakness (generalized)     Problem List Patient Active Problem List   Diagnosis Date Noted   SHOULDER STRAIN, RIGHT 03/20/2010   Ruben Im, PT 03/25/21 5:25 PM Phone: [phone] Fax: [phone]  Alvera Singh, PT 03/25/2021, 5:25 PM  [facility] @ [address] [place], [place], [address] Phone: [phone]   Fax:  [phone]  Name: Renee Montgomery MRN: [id number] Date of Birth: [birth year]

## 2021-03-25 NOTE — Patient Instructions (Signed)
Access Code: [email or web address] URL: [email or web address] Date: 03/25/2021 Prepared by: Ruben Im  Exercises Supine Hip Internal and External Rotation - 1 x daily - 7 x weekly - 1 sets - 20 reps Standing Hip Flexor Stretch - 1 x daily - 7 x weekly - 1 sets - 2 reps - 20 hold Quadruped Rocking Backward - 1 x daily - 7 x weekly - 1 sets - 10 reps Supine Bent Leg Lift with Knee Extension - 1 x daily - 7 x weekly - 1 sets - 10 reps Clamshell with Resistance - 1 x daily - 7 x weekly - 1 sets - 10 reps Hooklying Clamshell with Resistance - 1 x daily - 7 x weekly - 1 sets - 10 reps Prone Hip Extension - One Pillow - 1 x daily - 7 x weekly - 1 sets - 5 reps Prone Scapular Retraction Arms at Side - 1 x daily - 7 x weekly - 1 sets - 5 reps Supine Peroneal Nerve Glide - 1 x daily - 7 x weekly - 1 sets - 5 reps Supine Sciatic Nerve Glide - 1 x daily - 7 x weekly - 1 sets - 5 reps doorway hip flexor stretch - 1 x daily - 7 x weekly - 1 sets - 10 reps Standing Row with Anchored Resistance Band with PLB - 1 x daily - 7 x weekly - 1 sets - 10 reps Standing High Row with Anchored Resistance - 1 x daily - 7 x weekly - 1 sets - 10 reps Shoulder Extension with Resistance Hands Down - 1 x daily - 7 x weekly - 1 sets - 10 reps Seated Slump Nerve Glide - 1 x daily - 7 x weekly - 1 sets - 10 reps

## 2021-03-27 ENCOUNTER — Ambulatory Visit: Payer: PPO | Admitting: Physical Therapy

## 2021-03-27 ENCOUNTER — Other Ambulatory Visit: Payer: Self-pay

## 2021-03-27 ENCOUNTER — Other Ambulatory Visit (HOSPITAL_COMMUNITY): Payer: Self-pay

## 2021-03-27 DIAGNOSIS — Z01419 Encounter for gynecological examination (general) (routine) without abnormal findings: Secondary | ICD-10-CM | POA: Diagnosis not present

## 2021-03-27 DIAGNOSIS — Z124 Encounter for screening for malignant neoplasm of cervix: Secondary | ICD-10-CM | POA: Diagnosis not present

## 2021-03-27 DIAGNOSIS — M5416 Radiculopathy, lumbar region: Secondary | ICD-10-CM | POA: Diagnosis not present

## 2021-03-27 DIAGNOSIS — M5431 Sciatica, right side: Secondary | ICD-10-CM

## 2021-03-27 DIAGNOSIS — Z79890 Hormone replacement therapy (postmenopausal): Secondary | ICD-10-CM | POA: Diagnosis not present

## 2021-03-27 DIAGNOSIS — M6281 Muscle weakness (generalized): Secondary | ICD-10-CM

## 2021-03-27 MED ORDER — ESTRADIOL-NORETHINDRONE ACET 1-0.5 MG PO TABS
1.0000 | ORAL_TABLET | Freq: Every day | ORAL | 3 refills | Status: AC
  Filled 2021-03-27: qty 84, 84d supply, fill #0
  Filled 2021-06-12 (×2): qty 84, 84d supply, fill #1

## 2021-03-27 NOTE — Patient Instructions (Signed)
Access Code: [email or web address] URL: [email or web address] Date: 03/27/2021 Prepared by: Ruben Im  Exercises Supine Hip Internal and External Rotation - 1 x daily - 7 x weekly - 1 sets - 20 reps Standing Hip Flexor Stretch - 1 x daily - 7 x weekly - 1 sets - 2 reps - 20 hold Quadruped Rocking Backward - 1 x daily - 7 x weekly - 1 sets - 10 reps Supine Bent Leg Lift with Knee Extension - 1 x daily - 7 x weekly - 1 sets - 10 reps Clamshell with Resistance - 1 x daily - 7 x weekly - 1 sets - 10 reps Hooklying Clamshell with Resistance - 1 x daily - 7 x weekly - 1 sets - 10 reps Prone Hip Extension - One Pillow - 1 x daily - 7 x weekly - 1 sets - 5 reps Prone Scapular Retraction Arms at Side - 1 x daily - 7 x weekly - 1 sets - 5 reps Supine Peroneal Nerve Glide - 1 x daily - 7 x weekly - 1 sets - 5 reps Supine Sciatic Nerve Glide - 1 x daily - 7 x weekly - 1 sets - 5 reps doorway hip flexor stretch - 1 x daily - 7 x weekly - 1 sets - 10 reps Standing Row with Anchored Resistance Band with PLB - 1 x daily - 7 x weekly - 1 sets - 10 reps Standing High Row with Anchored Resistance - 1 x daily - 7 x weekly - 1 sets - 10 reps Shoulder Extension with Resistance Hands Down - 1 x daily - 7 x weekly - 1 sets - 10 reps Seated Slump Nerve Glide - 1 x daily - 7 x weekly - 1 sets - 10 reps Self Traction in Standing with Counter Top - 1 x daily - 7 x weekly - 1 sets - 3-5 reps - 20 hold

## 2021-03-27 NOTE — Therapy (Signed)
[facility] @ [address] [place], [place], [address] Phone: [phone]   Fax:  [phone]  Physical Therapy Treatment  Patient Details  Name: Renee Montgomery MRN: [id number] Date of Birth: Jun 22, 1950 Referring Provider (PT): Dr. Lujean Amel   Encounter Date: 03/27/2021   PT End of Session - 03/27/21 0834     Visit Number 7    Date for PT Re-Evaluation 04/29/21    Authorization Type Medicare 10th visit progress note    PT Start Time 0800    PT Stop Time 0842    PT Time Calculation (min) 42 min    Activity Tolerance Patient tolerated treatment well             Past Medical History:  Diagnosis Date   Anxiety    Thyroid disease    Hypo    Past Surgical History:  Procedure Laterality Date   AUGMENTATION MAMMAPLASTY Bilateral    BREAST ENHANCEMENT SURGERY  2003   FOREARM FRACTURE SURGERY  0626   left   UMBILICAL HERNIA REPAIR  1990    There were no vitals filed for this visit.   Subjective Assessment - 03/27/21 0803     Subjective I've still got the pain.  It's moving around more in my lower leg, sometimes my foot tingles.  My back is getting sore again.  I think the doorway one bothers me.  Demonstrates much improved hip external rotation bilaterally. Some soreness from hip mobs.  The DN with ES really helped my back, I felt taller and stronger.    Pertinent History osteopenia; saw Dr. 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 Yes    Pain Score 4     Pain Location Back    Pain Orientation Right    Pain Radiating Towards right lower leg                               [facility] Adult PT Treatment/Exercise - 03/27/21 0001       Lumbar Exercises: Standing   Other Standing Lumbar  Exercises discussed hold on doorway stretch since aggravating LE symptoms    Other Standing Lumbar Exercises standing self traction      Lumbar Exercises: Seated   Other Seated Lumbar Exercises continue neural floss since doing well      Lumbar Exercises: Supine   Ab Set 5 reps    Bent Knee Raise 10 reps      Lumbar Exercises: Quadruped   Other Quadruped Lumbar Exercises rocking with crossed ankles and angled bias      Traction   Type of Traction Lumbar    Min (lbs) 40    Max (lbs) 65    Hold Time 45    Rest Time 15    Time 12                     PT Education - 03/27/21 0833     Education Details standing traction    Person(s) Educated Patient    Methods Explanation;Demonstration;Handout    Comprehension Returned demonstration;Verbalized understanding              PT Short Term  Goals - 03/04/21 2022       PT SHORT TERM GOAL #1   Title The patient will demonstrate basic self care and ex's to relieve LE symptoms    Time 4    Period Weeks    Status New    Target Date 04/01/21      PT SHORT TERM GOAL #2   Title The patient will report a 30% reduction in right LE symptoms with walking medium distances    Time 4    Period Weeks    Status New      PT SHORT TERM GOAL #3   Title The patient will have improved bil lumbo/pelvic/hip strength to grossly 4+/5 needed for standing to cook and ascend steps    Time 4    Period Weeks    Status New               PT Long Term Goals - 03/04/21 2028       PT LONG TERM GOAL #1   Title The patient will be independent with safe self progression of HEP    Time 8    Period Weeks    Status New    Target Date 04/29/21      PT LONG TERM GOAL #2   Title The patient will report a 60% improvement in right LE symptoms with walking    Time 8    Period Weeks    Status New      PT LONG TERM GOAL #3   Title The patient will be able to walk her dog medium distances    Time 8    Period Weeks    Status New       PT LONG TERM GOAL #4   Title The patient will have 4+/5 to 5-/5 lumbo pelvic hip strength needed for lifting medium weight objects    Time 8    Period Weeks    Status New      PT LONG TERM GOAL #5   Title FOTO score improved to 62% indicating improved function with less pain    Time 8    Period Weeks    Status New                   Plan - 03/27/21 9470     Clinical Impression Statement The patient has continued right LE distal symptoms.  Hold on standing doorway exercise secondary to increased symptoms.  Will continue seated neural floss.    Initiated mechanical traction to decompress lumbar region for neural relief.  She reports some soreness in lumbar musculature following traction but overall "feels good,"  Therapist monitoring response throughout treatment session.    Personal Factors and Comorbidities Comorbidity 1;Time since onset of injury/illness/exacerbation    Examination-Activity Limitations Locomotion Level;Lift;Stand;Sleep    Rehab Potential Good    PT Frequency 2x / week    PT Duration 8 weeks    PT Treatment/Interventions ADLs/Self Care Home Management;Aquatic Therapy;Cryotherapy;Electrical Stimulation;Ultrasound;Traction;Moist Heat;Iontophoresis 4mg /ml Dexamethasone;Neuromuscular re-education;Therapeutic exercise;Therapeutic activities;Patient/family education;Manual techniques;Dry needling;Taping;Spinal Manipulations    PT Next Visit Plan check STGS next week;  assess response to mechanical traction;  ES/DN to lumbar multifidi; bil hip mobs; ex progression    PT Home Exercise Plan Access Code: [email or web address]             Patient will benefit from skilled therapeutic intervention in order to improve the following deficits and impairments:  Difficulty walking, Pain, Increased fascial restricitons, Impaired perceived functional ability, Decreased  activity tolerance, Decreased strength  Visit Diagnosis: Radiculopathy, lumbar region  Sciatica, right side  Muscle  weakness (generalized)     Problem List Patient Active Problem List   Diagnosis Date Noted   SHOULDER STRAIN, RIGHT 03/20/2010   Ruben Im, PT 03/27/21 1:55 PM Phone: [phone] Fax: [phone]  Alvera Singh, PT 03/27/2021, 1:55 PM  [facility] @ [address] [place], [place], [address] Phone: [phone]   Fax:  [phone]  Name: AVEENA BARI MRN: [id number] Date of Birth: [birth year]

## 2021-04-01 ENCOUNTER — Other Ambulatory Visit: Payer: Self-pay

## 2021-04-01 ENCOUNTER — Ambulatory Visit: Payer: PPO | Admitting: Physical Therapy

## 2021-04-01 DIAGNOSIS — M5416 Radiculopathy, lumbar region: Secondary | ICD-10-CM | POA: Diagnosis not present

## 2021-04-01 DIAGNOSIS — M5431 Sciatica, right side: Secondary | ICD-10-CM

## 2021-04-01 DIAGNOSIS — M6281 Muscle weakness (generalized): Secondary | ICD-10-CM

## 2021-04-01 NOTE — Patient Instructions (Signed)
Access Code: [email or web address] URL: [email or web address] Date: 04/01/2021 Prepared by: Ruben Im  Exercises Supine Hip Internal and External Rotation - 1 x daily - 7 x weekly - 1 sets - 20 reps Standing Hip Flexor Stretch - 1 x daily - 7 x weekly - 1 sets - 2 reps - 20 hold Quadruped Rocking Backward - 1 x daily - 7 x weekly - 1 sets - 10 reps Supine Bent Leg Lift with Knee Extension - 1 x daily - 7 x weekly - 1 sets - 10 reps Clamshell with Resistance - 1 x daily - 7 x weekly - 1 sets - 10 reps Hooklying Clamshell with Resistance - 1 x daily - 7 x weekly - 1 sets - 10 reps Prone Hip Extension - One Pillow - 1 x daily - 7 x weekly - 1 sets - 5 reps Prone Scapular Retraction Arms at Side - 1 x daily - 7 x weekly - 1 sets - 5 reps Supine Peroneal Nerve Glide - 1 x daily - 7 x weekly - 1 sets - 5 reps Supine Sciatic Nerve Glide - 1 x daily - 7 x weekly - 1 sets - 5 reps doorway hip flexor stretch - 1 x daily - 7 x weekly - 1 sets - 10 reps Standing Row with Anchored Resistance Band with PLB - 1 x daily - 7 x weekly - 1 sets - 10 reps Standing High Row with Anchored Resistance - 1 x daily - 7 x weekly - 1 sets - 10 reps Shoulder Extension with Resistance Hands Down - 1 x daily - 7 x weekly - 1 sets - 10 reps Seated Slump Nerve Glide - 1 x daily - 7 x weekly - 1 sets - 10 reps Self Traction in Standing with Counter Top - 1 x daily - 7 x weekly - 1 sets - 3-5 reps - 20 hold SNATCH AND PRESS OVERHEAD - 1 x daily - 7 x weekly - 2 sets - 10 reps

## 2021-04-01 NOTE — Therapy (Signed)
[facility] @ [place], [place], [address] Phone: [phone]   Fax:  [phone]  Physical Therapy Treatment  Patient Details  Name: Renee Montgomery MRN: [id number] Date of Birth: [birth year] Referring Provider (PT): Dr. Lujean Amel   Encounter Date: 04/01/2021   PT End of Session - 04/01/21 1303     Visit Number 8    Date for PT Re-Evaluation 04/29/21    Authorization Type Medicare 10th visit progress note    PT Start Time 1232    PT Stop Time 1315    PT Time Calculation (min) 43 min    Activity Tolerance Patient tolerated treatment well             Past Medical History:  Diagnosis Date   Anxiety    Thyroid disease    Hypo    Past Surgical History:  Procedure Laterality Date   AUGMENTATION MAMMAPLASTY Bilateral    BREAST ENHANCEMENT SURGERY  2003   FOREARM FRACTURE SURGERY  2458   left   UMBILICAL HERNIA REPAIR  1990    There were no vitals filed for this visit.   Subjective Assessment - 04/01/21 1234     Subjective Went dancing Saturday and I could feel it later.  I hurt that evening after traction.  The next day went to line dancing but I felt fine.  Back pain had been better but the sciatica is there.    Pertinent History osteopenia; saw Dr. Saintclair Halsted and will return in early Dec    How long can you stand comfortably? 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 Yes    Pain Score 1     Pain Location Back    Pain Orientation Right    Pain Type Chronic pain    Pain Radiating Towards right buttock  to lower leg and foot                [facility] PT Assessment - 04/01/21 0001       Strength   Right Hip Extension 4+/5    Right Hip ABduction 4+/5    Left Hip Extension 4+/5     Left Hip ABduction 4+/5    Lumbar Flexion 4/5    Lumbar Extension 4/5                           [facility] Adult PT Treatment/Exercise - 04/01/21 0001       Lumbar Exercises: Standing   Other Standing Lumbar Exercises 3# snatch and press overhead 10x right/left      Lumbar Exercises: Seated   Other Seated Lumbar Exercises 3# snatch and press overhead 5x each side      Electrical Stimulation   Electrical Stimulation Location bil lumbar multifidi and right glutes    Electrical Stimulation Action with DN    Electrical Stimulation Parameters 1.5 ma 8 min    Electrical Stimulation Goals Pain      Manual Therapy   Manual therapy comments --    Joint Mobilization --    Soft tissue mobilization lumbar and Rt gluteals              Trigger Point Dry Needling - 04/01/21  0001     Consent Given? Yes    Education Handout Provided Yes    Muscles Treated Back/Hip Lumbar multifidi;Gluteus medius;Gluteus minimus    Electrical Stimulation Performed with Dry Needling Yes    E-stim with Dry Needling Details bil multifidi and right glute    Other Dry Needling bil multifidi L4-S1, Rt hip only    Gluteus Minimus Response Twitch response elicited;Palpable increased muscle length    Gluteus Medius Response Twitch response elicited;Palpable increased muscle length    Piriformis Response Palpable increased muscle length    Lumbar multifidi Response Twitch response elicited;Palpable increased muscle length   most notable at Lt L4/5                  PT Education - 04/01/21 1729     Education Details seated or standing snatch and press overhead    Person(s) Educated Patient    Methods Explanation;Demonstration;Handout    Comprehension Returned demonstration;Verbalized understanding              PT Short Term Goals - 04/01/21 1240       PT SHORT TERM GOAL #1   Title The patient will demonstrate basic self care and ex's to relieve LE symptoms    Status Achieved       PT SHORT TERM GOAL #2   Title The patient will report a 30% reduction in right LE symptoms with walking medium distances    Baseline overall 30%    Status Achieved      PT SHORT TERM GOAL #3   Title The patient will have improved bil lumbo/pelvic/hip strength to grossly 4+/5 needed for standing to cook and ascend steps    Status Achieved               PT Long Term Goals - 03/04/21 2028       PT LONG TERM GOAL #1   Title The patient will be independent with safe self progression of HEP    Time 8    Period Weeks    Status New    Target Date 04/29/21      PT LONG TERM GOAL #2   Title The patient will report a 60% improvement in right LE symptoms with walking    Time 8    Period Weeks    Status New      PT LONG TERM GOAL #3   Title The patient will be able to walk her dog medium distances    Time 8    Period Weeks    Status New      PT LONG TERM GOAL #4   Title The patient will have 4+/5 to 5-/5 lumbo pelvic hip strength needed for lifting medium weight objects    Time 8    Period Weeks    Status New      PT LONG TERM GOAL #5   Title FOTO score improved to 62% indicating improved function with less pain    Time 8    Period Weeks    Status New                   Plan - 04/01/21 1307     Clinical Impression Statement The patient reports variable LBP and right LE symptoms but rates overall progress at 30%.  Good response to DN with ES to bil lumbar soft tissue in addition to right glute musculature with improved soft tissue mobility and decreased tender points.  Able to progress  core strengthening using 3# weights in sitting and standing.  Good progress with STGs.  Therapist monitoring response especially during DN secondary to previous sympathetic  symptoms.    Personal Factors and Comorbidities Comorbidity 1;Time since onset of injury/illness/exacerbation    Comorbidities osteopenia    Examination-Activity Limitations Locomotion Level;Lift;Stand;Sleep     Rehab Potential Good    PT Frequency 2x / week    PT Duration 8 weeks    PT Treatment/Interventions ADLs/Self Care Home Management;Aquatic Therapy;Cryotherapy;Electrical Stimulation;Ultrasound;Traction;Moist Heat;Iontophoresis 4mg /ml Dexamethasone;Neuromuscular re-education;Therapeutic exercise;Therapeutic activities;Patient/family education;Manual techniques;Dry needling;Taping;Spinal Manipulations    PT Next Visit Plan ES/DN to lumbar multifidi and right glutes; bil hip mobs; ex progression;  possible hip hinge dead lift progression    PT Home Exercise Plan Access Code: [email or web address]             Patient will benefit from skilled therapeutic intervention in order to improve the following deficits and impairments:  Difficulty walking, Pain, Increased fascial restricitons, Impaired perceived functional ability, Decreased activity tolerance, Decreased strength  Visit Diagnosis: Radiculopathy, lumbar region  Sciatica, right side  Muscle weakness (generalized)     Problem List Patient Active Problem List   Diagnosis Date Noted   [place], RIGHT 03/20/2010   Ruben Im, PT 04/01/21 5:36 PM Phone: [phone] Fax: [phone]  Alvera Singh, PT 04/01/2021, 5:36 PM  [facility] @ [place], [place], [address] Phone: [phone]   Fax:  [phone]  Name: Renee Montgomery MRN: [id number] Date of Birth: 1950/08/27

## 2021-04-08 ENCOUNTER — Ambulatory Visit: Payer: PPO | Admitting: Physical Therapy

## 2021-04-08 ENCOUNTER — Other Ambulatory Visit: Payer: Self-pay

## 2021-04-08 DIAGNOSIS — M5416 Radiculopathy, lumbar region: Secondary | ICD-10-CM

## 2021-04-08 DIAGNOSIS — M6281 Muscle weakness (generalized): Secondary | ICD-10-CM

## 2021-04-08 DIAGNOSIS — M5431 Sciatica, right side: Secondary | ICD-10-CM

## 2021-04-08 NOTE — Therapy (Signed)
[facility] @ [place], [place], [address] Phone: [phone]   Fax:  [phone]  Physical Therapy Treatment  Patient Details  Name: Renee Montgomery MRN: [id number] Date of Birth: 09-Dec-1950 Referring Provider (PT): Dr. Lujean Amel   Encounter Date: 04/08/2021   PT End of Session - 04/08/21 1406     Visit Number 9    Date for PT Re-Evaluation 04/29/21    Authorization Type Medicare 10th visit progress note    PT Start Time 1230    PT Stop Time 1330    PT Time Calculation (min) 60 min    Activity Tolerance Patient limited by pain             Past Medical History:  Diagnosis Date   Anxiety    Thyroid disease    Hypo    Past Surgical History:  Procedure Laterality Date   AUGMENTATION MAMMAPLASTY Bilateral    BREAST ENHANCEMENT SURGERY  2003   FOREARM FRACTURE SURGERY  8466   left   UMBILICAL HERNIA REPAIR  1990    There were no vitals filed for this visit.   Subjective Assessment - 04/08/21 1233     Subjective I was doing great until Saturday then I cleaned my house, cooked and decorated.  Felt in both back and right buttock and lower leg.  I walked my dog and had tears running down my face.  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  Did all my exercises in the evening and I'm better today.    Pertinent History osteopenia; saw Dr. Saintclair Halsted and will return in early Dec    How long can you stand comfortably? 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 Yes    Pain Score 2     Pain Location Back    Pain Orientation Right                               [facility] Adult PT Treatment/Exercise - 04/08/21 0001       Lumbar Exercises: Stretches   Prone on Elbows Stretch 1 rep    Other  Lumbar Stretch Exercise childs pose with Addaday      Modalities   Modalities Electrical Stimulation;Moist Heat      Moist Heat Therapy   Number Minutes Moist Heat 10 Minutes    Moist Heat Location Lumbar Spine      Electrical Stimulation   Electrical Stimulation Location right lumbar paraspinals and right glute    Electrical Stimulation Action IFC    Electrical Stimulation Parameters 9 ma 10 min    Electrical Stimulation Goals Pain      Manual Therapy   Manual therapy comments right piriformis contract/relax 3x 5 sec    Joint Mobilization right hip long axis distraction, inferiorglide, and AP in internal rotation grade 3 3x 30 each    Soft tissue mobilization right glutes and piriformis; with and without Addaday instrument assist              Trigger Point Dry Needling - 04/08/21 0001     Consent Given? Yes    Education Handout Provided Yes    Muscles Treated Back/Hip Lumbar multifidi;Gluteus  medius;Gluteus minimus    Electrical Stimulation Performed with Dry Needling Yes    E-stim with Dry Needling Details attempted ES/DN to right multifidi but very intensely painful so discontinued at 2 min    Other Dry Needling bil multifidi L4-S1, Rt hip only    Gluteus Minimus Response Twitch response elicited;Palpable increased muscle length    Gluteus Medius Response Twitch response elicited;Palpable increased muscle length    Piriformis Response Palpable increased muscle length    Lumbar multifidi Response Twitch response elicited;Palpable increased muscle length   pt unable to tolerate on right multifidi, very intense so discontinued                  PT Education - 04/08/21 1324     Education Details home TENS info    Person(s) Educated Patient    Methods Explanation;Handout    Comprehension Returned demonstration;Verbalized understanding              PT Short Term Goals - 04/01/21 1240       PT SHORT TERM GOAL #1   Title The patient will demonstrate basic  self care and ex's to relieve LE symptoms    Status Achieved      PT SHORT TERM GOAL #2   Title The patient will report a 30% reduction in right LE symptoms with walking medium distances    Baseline overall 30%    Status Achieved      PT SHORT TERM GOAL #3   Title The patient will have improved bil lumbo/pelvic/hip strength to grossly 4+/5 needed for standing to cook and ascend steps    Status Achieved               PT Long Term Goals - 03/04/21 2028       PT LONG TERM GOAL #1   Title The patient will be independent with safe self progression of HEP    Time 8    Period Weeks    Status New    Target Date 04/29/21      PT LONG TERM GOAL #2   Title The patient will report a 60% improvement in right LE symptoms with walking    Time 8    Period Weeks    Status New      PT LONG TERM GOAL #3   Title The patient will be able to walk her dog medium distances    Time 8    Period Weeks    Status New      PT LONG TERM GOAL #4   Title The patient will have 4+/5 to 5-/5 lumbo pelvic hip strength needed for lifting medium weight objects    Time 8    Period Weeks    Status New      PT LONG TERM GOAL #5   Title FOTO score improved to 62% indicating improved function with less pain    Time 8    Period Weeks    Status New                   Plan - 04/08/21 1406     Clinical Impression Statement The patient reports she did well for 1 week before an exacerbation of symptoms on Saturday as she prepared for a family visit.  She feels previous DN/manual therapy has been productive and she wishes to continue treating the myofascial issues in these areas.  She tolerated gluteal and piriformis muscle DN well however right lumbar multifidi was  highly painful today and discontinued treatment in this area quickly based on her response.  Reports intense soreness following and therefore applied interferential surface electrodes with heat which did alleviate discomfort.  Therapist  continually monitoring symptoms and modifying treatment accordingly.    Personal Factors and Comorbidities Comorbidity 1;Time since onset of injury/illness/exacerbation    Comorbidities osteopenia    Examination-Activity Limitations Locomotion Level;Lift;Stand;Sleep    Rehab Potential Good    PT Frequency 2x / week    PT Duration 8 weeks    PT Treatment/Interventions ADLs/Self Care Home Management;Aquatic Therapy;Cryotherapy;Electrical Stimulation;Ultrasound;Traction;Moist Heat;Iontophoresis 4mg /ml Dexamethasone;Neuromuscular re-education;Therapeutic exercise;Therapeutic activities;Patient/family education;Manual techniques;Dry needling;Taping;Spinal Manipulations    PT Next Visit Plan 10th visit prog note; may hold DN secondary to pain intensity/reactivity;  right hip mobs, lumbar neutral gapping;  TENS; ex progression    PT Home Exercise Plan Access Code: [email or web address]             Patient will benefit from skilled therapeutic intervention in order to improve the following deficits and impairments:  Difficulty walking, Pain, Increased fascial restricitons, Impaired perceived functional ability, Decreased activity tolerance, Decreased strength  Visit Diagnosis: Radiculopathy, lumbar region  Sciatica, right side  Muscle weakness (generalized)     Problem List Patient Active Problem List   Diagnosis Date Noted   [place], RIGHT 03/20/2010   Ruben Im, PT 04/08/21 2:18 PM Phone: [phone] Fax: [phone]  Alvera Singh, PT 04/08/2021, 2:17 PM  [facility] @ [place], [place], [address] Phone: [phone]   Fax:  [phone]  Name: Renee Montgomery MRN: [id number] Date of Birth: 29-Nov-1950

## 2021-04-08 NOTE — Patient Instructions (Signed)

## 2021-04-15 ENCOUNTER — Ambulatory Visit: Payer: PPO | Attending: Family Medicine | Admitting: Physical Therapy

## 2021-04-15 ENCOUNTER — Other Ambulatory Visit: Payer: Self-pay

## 2021-04-15 DIAGNOSIS — M6281 Muscle weakness (generalized): Secondary | ICD-10-CM | POA: Diagnosis not present

## 2021-04-15 DIAGNOSIS — M5416 Radiculopathy, lumbar region: Secondary | ICD-10-CM | POA: Insufficient documentation

## 2021-04-15 DIAGNOSIS — M5431 Sciatica, right side: Secondary | ICD-10-CM | POA: Insufficient documentation

## 2021-04-15 NOTE — Therapy (Signed)
[facility] @ [place], [place], [address] Phone: [phone]   Fax:  [phone]  Physical Therapy Treatment  Patient Details  Name: DARRIANA Montgomery MRN: [id number] Date of Birth: 07/12/1950 Referring Provider (PT): Dr. Lujean Amel  Progress Note Reporting Period 03/04/21 to 04/15/21  See note below for Objective Data and Assessment of Progress/Goals.     Encounter Date: 04/15/2021   PT End of Session - 04/15/21 1247     Visit Number 10    Date for PT Re-Evaluation 04/29/21    Authorization Type Medicare 20thvisit progress note    PT Start Time 1230    PT Stop Time 1320    PT Time Calculation (min) 50 min    Activity Tolerance Patient limited by pain             Past Medical History:  Diagnosis Date   Anxiety    Thyroid disease    Hypo    Past Surgical History:  Procedure Laterality Date   AUGMENTATION MAMMAPLASTY Bilateral    BREAST ENHANCEMENT SURGERY  2003   FOREARM FRACTURE SURGERY  2025   left   UMBILICAL HERNIA REPAIR  1990    There were no vitals filed for this visit.   Subjective Assessment - 04/15/21 1232     Subjective It's not been good.  I went to line dance on Friday and then Saturday through yesterday horrible sciatica. I hurt in the night last night but normally goes away.  Didn't exercise an I'm better today.  Starts buttock and down to foot.  It's been 10 out of 10.    Pertinent History osteopenia; saw Dr. Saintclair Halsted and will return in early Dec    How long can you stand comfortably? 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 Yes    Pain Location Back                [facility] PT Assessment - 04/15/21 0001       Observation/Other Assessments   Focus on  Therapeutic Outcomes (FOTO)  not done today secondary to exacerbation      Strength   Overall Strength Comments improved SLS to 10 sec    Right Hip Extension 4+/5    Right Hip ABduction 4+/5    Left Hip Extension 4+/5    Left Hip ABduction 4+/5    Lumbar Flexion 4/5    Lumbar Extension 4/5                           [facility] Adult PT Treatment/Exercise - 04/15/21 0001       Lumbar Exercises: Seated   Other Seated Lumbar Exercises discussion of exercise moderation      Moist Heat Therapy   Number Minutes Moist Heat 3 Minutes    Moist Heat Location Hip      Electrical Stimulation   Electrical Stimulation Location right gluteals    Electrical Stimulation Action with DN    Electrical Stimulation Parameters 1.5 ma 8 min    Electrical Stimulation Goals Pain      Manual Therapy   Manual therapy comments right piriformis contract/relax 3x 5 sec    Joint Mobilization right  inferior mob, AP in internal rotation 3x 30 sec each    Soft tissue mobilization Rt gluteals              Trigger Point Dry Needling - 04/15/21 0001     Consent Given? Yes    Dry Needling Comments right    Electrical Stimulation Performed with Dry Needling Yes    Gluteus Minimus Response Twitch response elicited;Palpable increased muscle length    Gluteus Medius Response Twitch response elicited;Palpable increased muscle length                     PT Short Term Goals - 04/01/21 1240       PT SHORT TERM GOAL #1   Title The patient will demonstrate basic self care and ex's to relieve LE symptoms    Status Achieved      PT SHORT TERM GOAL #2   Title The patient will report a 30% reduction in right LE symptoms with walking medium distances    Baseline overall 30%    Status Achieved      PT SHORT TERM GOAL #3   Title The patient will have improved bil lumbo/pelvic/hip strength to grossly 4+/5 needed for standing to cook and ascend steps    Status Achieved                PT Long Term Goals - 04/15/21 1416       PT LONG TERM GOAL #1   Title The patient will be independent with safe self progression of HEP    Time 8    Period Weeks    Status On-going      PT LONG TERM GOAL #2   Title The patient will report a 60% improvement in right LE symptoms with walking    Time 8    Period Weeks    Status On-going      PT LONG TERM GOAL #3   Title The patient will be able to walk her dog medium distances    Time 8    Period Weeks    Status On-going      PT LONG TERM GOAL #4   Title The patient will have 4+/5 to 5-/5 lumbo pelvic hip strength needed for lifting medium weight objects    Time 8    Period Weeks    Status On-going      PT LONG TERM GOAL #5   Title FOTO score improved to 62% indicating improved function with less pain    Time 8    Period Weeks    Status On-going                   Plan - 04/15/21 1320     Clinical Impression Statement The patient reports increased sciatica over the last several days.  We discussed modification of ex's temporarily including a hold on prone multifidi press and anything that increases LE paresthesia.  She had a very positive response to DN with ES to glutes today with reports of a " good release".  Improved soft tissue mobility noted after treament and improved hip mobility noted as well.  If symptoms under better control in 1 week will progress ex's for core strengthening in standing.  The patient has been improving overall until this recent set back.    Personal Factors and Comorbidities Comorbidity 1;Time since onset of injury/illness/exacerbation    Comorbidities osteopenia    Examination-Activity Limitations Locomotion Level;Lift;Stand;Sleep    Rehab  Potential Good    PT Frequency 2x / week    PT Duration 8 weeks    PT Treatment/Interventions ADLs/Self Care Home Management;Aquatic Therapy;Cryotherapy;Electrical Stimulation;Ultrasound;Traction;Moist Heat;Iontophoresis 4mg /ml  Dexamethasone;Neuromuscular re-education;Therapeutic exercise;Therapeutic activities;Patient/family education;Manual techniques;Dry needling;Taping;Spinal Manipulations    PT Next Visit Plan assess response to DN/ES to glutes; if symptoms improved try hip hinge wit/dead lifts,  standing core (pallof press);  do FOTO next visit    PT Home Exercise Plan Access Code: [email or web address]             Patient will benefit from skilled therapeutic intervention in order to improve the following deficits and impairments:  Difficulty walking, Pain, Increased fascial restricitons, Impaired perceived functional ability, Decreased activity tolerance, Decreased strength  Visit Diagnosis: Radiculopathy, lumbar region  Sciatica, right side  Muscle weakness (generalized)     Problem List Patient Active Problem List   Diagnosis Date Noted   [place], RIGHT 03/20/2010   Ruben Im, PT 04/15/21 2:18 PM Phone: [phone] Fax: [phone]  Alvera Singh, PT 04/15/2021, 2:17 PM  [facility] @ [place], [place], [address] Phone: [phone]   Fax:  [phone]  Name: Renee Montgomery MRN: [id number] Date of Birth: March 25, 1951

## 2021-04-22 ENCOUNTER — Other Ambulatory Visit: Payer: Self-pay

## 2021-04-22 ENCOUNTER — Ambulatory Visit: Payer: PPO | Admitting: Physical Therapy

## 2021-04-22 DIAGNOSIS — M5416 Radiculopathy, lumbar region: Secondary | ICD-10-CM

## 2021-04-22 DIAGNOSIS — M5431 Sciatica, right side: Secondary | ICD-10-CM

## 2021-04-22 DIAGNOSIS — M6281 Muscle weakness (generalized): Secondary | ICD-10-CM

## 2021-04-22 NOTE — Therapy (Signed)
[facility] @ [address] [place], [place], [address] Phone: [phone]   Fax:  [phone]  Physical Therapy Treatment  Patient Details  Name: Renee Montgomery MRN: [id number] Date of Birth: March 18, 1951 Referring Provider (PT): Dr. Lujean Amel   Encounter Date: 04/22/2021   PT End of Session - 04/22/21 1435     Visit Number 11    Date for PT Re-Evaluation 04/29/21    Authorization Type Medicare 20thvisit progress note    PT Start Time 1016    PT Stop Time 1100    PT Time Calculation (min) 44 min    Activity Tolerance Patient tolerated treatment well             Past Medical History:  Diagnosis Date   Anxiety    Thyroid disease    Hypo    Past Surgical History:  Procedure Laterality Date   AUGMENTATION MAMMAPLASTY Bilateral    BREAST ENHANCEMENT SURGERY  2003   FOREARM FRACTURE SURGERY  6761   left   UMBILICAL HERNIA REPAIR  1990    There were no vitals filed for this visit.   Subjective Assessment - 04/22/21 1017     Subjective I've been a lot better.  Still flares up when I walk the dog, produces sciatica.   Treating my hip really helped.  I was able to sleep on that right side and sleeping a lot better!   Did my line dancing and I always hurt that evening.    Pertinent History osteopenia; saw Dr. Saintclair Halsted and will return in early Dec    Currently in Pain? No/denies    Pain Score 0-No pain                [facility] PT Assessment - 04/22/21 0001       Observation/Other Assessments   Focus on Therapeutic Outcomes (FOTO)  53%                           [facility] Adult PT Treatment/Exercise - 04/22/21 0001       Self-Care   Self-Care Other Self-Care Comments    Other Self-Care Comments  home TENS set up and electrode placement      Lumbar Exercises: Aerobic   Nustep 3 min L5      Lumbar Exercises: Machines for Strengthening   Other Lumbar Machine Exercise seated lat bar 35#  10x   'this feels good"     Lumbar Exercises: Standing   Other Standing Lumbar Exercises hip hinge with golf club with heels elevated 8x    Other Standing Lumbar Exercises resisted backards walk 15# 8x      Lumbar Exercises: Seated   Other Seated Lumbar Exercises 3000 gram blue ball hip to hip; hip to shoulder; Vs; golf swing, ear to ear 45 sec each      Manual Therapy   Soft tissue mobilization right gluteals in sidelying with instrument assist ([employer])                     PT Education - 04/22/21 1432     Education Details seated core series with plyo ball    Person(s) Educated Patient    Methods Explanation;Demonstration;Handout    Comprehension Verbalized understanding              PT Short Term Goals - 04/01/21 1240       PT SHORT TERM GOAL #1  Title The patient will demonstrate basic self care and ex's to relieve LE symptoms    Status Achieved      PT SHORT TERM GOAL #2   Title The patient will report a 30% reduction in right LE symptoms with walking medium distances    Baseline overall 30%    Status Achieved      PT SHORT TERM GOAL #3   Title The patient will have improved bil lumbo/pelvic/hip strength to grossly 4+/5 needed for standing to cook and ascend steps    Status Achieved               PT Long Term Goals - 04/15/21 1416       PT LONG TERM GOAL #1   Title The patient will be independent with safe self progression of HEP    Time 8    Period Weeks    Status On-going      PT LONG TERM GOAL #2   Title The patient will report a 60% improvement in right LE symptoms with walking    Time 8    Period Weeks    Status On-going      PT LONG TERM GOAL #3   Title The patient will be able to walk her dog medium distances    Time 8    Period Weeks    Status On-going      PT LONG TERM GOAL #4   Title The patient will have 4+/5 to 5-/5 lumbo pelvic hip strength needed for lifting medium weight objects    Time 8    Period Weeks     Status On-going      PT LONG TERM GOAL #5   Title FOTO score improved to 62% indicating improved function with less pain    Time 8    Period Weeks    Status On-going                   Plan - 04/22/21 1436     Clinical Impression Statement Although her FOTO score has not changed, the patient states she knows she is better.  She states she has been able to sleep on her side for the first time in a long time.  Less overall back and hip pain although walking the dog continues to produce sciatic-like LE symptoms.  She just got a home TENS unit and we discussed wear time and placement and recommended use for walking the dog or with other activities where she is on her feet.  Alternated between seated and standing core strengthening ex's.  Despite heel elevation to reduce neural symptoms, LE symptoms still produced in standing and also on Nu-Step.  Partially relieved with sitting and with instrument assisted soft tissue manual therapy to gluteals.  Therapist closely monitoring response and modifying treatment accordingly.    Personal Factors and Comorbidities Comorbidity 1;Time since onset of injury/illness/exacerbation    Comorbidities osteopenia    Examination-Participation Restrictions Meal Prep;Community Activity;Other    Rehab Potential Good    PT Frequency 2x / week    PT Duration 8 weeks    PT Treatment/Interventions ADLs/Self Care Home Management;Aquatic Therapy;Cryotherapy;Electrical Stimulation;Ultrasound;Traction;Moist Heat;Iontophoresis 4mg /ml Dexamethasone;Neuromuscular re-education;Therapeutic exercise;Therapeutic activities;Patient/family education;Manual techniques;Dry needling;Taping;Spinal Manipulations    PT Next Visit Plan ERO next week (FOTO already done);  seated lat bar;  review seated core ex as needed.  try seated rows;  DN with ES to glutes as needed; seated Pallof or 1/2 kneel?    PT Home Exercise  Plan Access Code: [email or web address]             Patient will benefit  from skilled therapeutic intervention in order to improve the following deficits and impairments:  Difficulty walking, Pain, Increased fascial restricitons, Impaired perceived functional ability, Decreased activity tolerance, Decreased strength  Visit Diagnosis: Radiculopathy, lumbar region  Sciatica, right side  Muscle weakness (generalized)     Problem List Patient Active Problem List   Diagnosis Date Noted   SHOULDER STRAIN, RIGHT 03/20/2010   Ruben Im, PT 04/22/21 2:43 PM Phone: [phone] Fax: [phone]  Alvera Singh, PT 04/22/2021, 2:43 PM  [facility] @ [address] [place], [place], [address] Phone: [phone]   Fax:  [phone]  Name: Renee Montgomery MRN: [id number] Date of Birth: [birth year]

## 2021-04-22 NOTE — Patient Instructions (Signed)
Seated with 5 to 8#: 30-45 sec each  Hip to hip  Hip to opposite shoulder  Switch sides hip to opp shoulder  V formation: shoulder to belly button to opp shoulder   Golf swings  Ear to ear

## 2021-05-01 ENCOUNTER — Other Ambulatory Visit: Payer: Self-pay

## 2021-05-01 ENCOUNTER — Ambulatory Visit: Payer: PPO | Admitting: Physical Therapy

## 2021-05-01 DIAGNOSIS — M5431 Sciatica, right side: Secondary | ICD-10-CM

## 2021-05-01 DIAGNOSIS — M5416 Radiculopathy, lumbar region: Secondary | ICD-10-CM | POA: Diagnosis not present

## 2021-05-01 DIAGNOSIS — M6281 Muscle weakness (generalized): Secondary | ICD-10-CM

## 2021-05-01 NOTE — Therapy (Signed)
[facility] @ [address] [place], [place], [address] Phone: [phone]   Fax:  [phone]  Physical Therapy Treatment/Recertification  Patient Details  Name: MONAY HOULTON MRN: [id number] Date of Birth: [birth year] Referring Provider (PT): Dr. Lujean Amel   Encounter Date: 05/01/2021   PT End of Session - 05/01/21 1138     Visit Number 12    Date for PT Re-Evaluation 06/12/21    Authorization Type Medicare 20thvisit progress note    PT Start Time 1100    PT Stop Time 1145    PT Time Calculation (min) 45 min    Activity Tolerance Patient tolerated treatment well             Past Medical History:  Diagnosis Date   Anxiety    Thyroid disease    Hypo    Past Surgical History:  Procedure Laterality Date   AUGMENTATION MAMMAPLASTY Bilateral    BREAST ENHANCEMENT SURGERY  2003   FOREARM FRACTURE SURGERY  6712   left   UMBILICAL HERNIA REPAIR  1990    There were no vitals filed for this visit.   Subjective Assessment - 05/01/21 1055     Subjective Not good with the sciatica.  I think I need to be needled today. I tried mopping and vacumning yesterday and then  I couldn't even stand up straight.  Better overnight.  Painful week.  Sitting breaks help.   Used TENS.    Pertinent History osteopenia; saw Dr. 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 Yes    Pain Location Back  and right LE 5/10    Pain Type Chronic pain                [facility] PT Assessment - 05/01/21 0001       Observation/Other Assessments   Focus on Therapeutic Outcomes (FOTO)  53%      Strength   Overall Strength Comments improved SLS to 10 sec    Right Hip Extension 4+/5    Right  Hip ABduction 4+/5    Left Hip Extension 4+/5    Left Hip ABduction 4+/5    Lumbar Flexion 4/5    Lumbar Extension 4/5                           [facility] Adult PT Treatment/Exercise - 05/01/21 0001       Self-Care   Other Self-Care Comments  discussion activity modification strategies for holiday prep: 5 min sitting every hour on the hour and midday 15 min decompression lying down      Moist Heat Therapy   Number Minutes Moist Heat 3 Minutes    Moist Heat Location Hip      Manual Therapy   Soft tissue mobilization Rt gluteals and right lumbar paraspinals              Trigger Point Dry Needling - 05/01/21 0001     Consent Given? Yes    Muscles Treated Back/Hip Lumbar multifidi;Gluteus medius;Gluteus minimus    Dry Needling Comments right    Other Dry Needling no lumbar "marination" 5 sec place and hold 5 sec  no pistoning    Gluteus Minimus Response Twitch response elicited;Palpable increased muscle length    Gluteus Medius Response Twitch response elicited;Palpable increased muscle length    Piriformis Response Palpable increased muscle length    Lumbar multifidi Response Palpable increased muscle length;Twitch response elicited                     PT Short Term Goals - 05/01/21 1633       PT SHORT TERM GOAL #1   Title The patient will demonstrate basic self care and ex's to relieve LE symptoms    Status Achieved      PT SHORT TERM GOAL #2   Title The patient will report a 30% reduction in right LE symptoms with walking medium distances    Status Achieved      PT SHORT TERM GOAL #3   Title The patient will have improved bil lumbo/pelvic/hip strength to grossly 4+/5 needed for standing to cook and ascend steps    Status Achieved               PT Long Term Goals - 05/01/21 1141       PT LONG TERM GOAL #1   Title The patient will be independent with safe self progression of HEP    Time 6    Period Weeks    Status On-going     Target Date 06/12/21      PT LONG TERM GOAL #2   Title The patient will report a 60% improvement in right LE symptoms with walking    Time 6    Period Weeks    Status On-going      PT LONG TERM GOAL #3   Title The patient will be able to walk her dog medium distances    Time 6    Period Weeks    Status On-going      PT LONG TERM GOAL #4   Title The patient will have 4+/5 to 5-/5 lumbo pelvic hip strength needed for lifting medium weight objects    Time 6    Period Weeks    Status On-going      PT LONG TERM GOAL #5   Title FOTO score improved to 62% indicating improved function with less pain    Time 6    Period Weeks    Status On-going                   Plan - 05/01/21 1626     Clinical Impression Statement The patient called to schedule an appt today secondary to an exacerbation of symptoms and hope for relief prior to the upcoming holiday.  She reports the sciatica is immediate upon standing and relieved typically with sitting.  We discussed strategies to help mitigate the pain.  Although she has had intense pain with DN at times she feels it has been helpful and would like to try this again.   She tolerated DN of gluteals and piriformis well.  Used "non-marinating" technique with multifidi, short duration and she tolerated that well.  She had numerous tender points in both regions.  Although she has had ups and downs in overall response, she feels the general trend has been improvement.  Therapist monitoring response closely and modifying as needed.  Recommend continued PT for pain management, return to ex progression and pt education.    Personal Factors and Comorbidities Comorbidity 1;Time since onset of injury/illness/exacerbation    Comorbidities osteopenia  Examination-Participation Restrictions Meal Prep;Community Activity;Other    Rehab Potential Good    PT Frequency 2x / week    PT Duration 6 weeks    PT Treatment/Interventions ADLs/Self Care Home  Management;Aquatic Therapy;Cryotherapy;Electrical Stimulation;Ultrasound;Traction;Moist Heat;Iontophoresis 4mg /ml Dexamethasone;Neuromuscular re-education;Therapeutic exercise;Therapeutic activities;Patient/family education;Manual techniques;Dry needling;Taping;Spinal Manipulations    PT Next Visit Plan seated lat bar;  review seated core ex as needed.  try seated rows;  DN with ES to glutes as needed: DN to lumbar multifidi no marinate; seated Pallof or 1/2 kneel?    PT Home Exercise Plan Access Code: [email or web address]             Patient will benefit from skilled therapeutic intervention in order to improve the following deficits and impairments:  Difficulty walking, Pain, Increased fascial restricitons, Impaired perceived functional ability, Decreased activity tolerance, Decreased strength  Visit Diagnosis: Radiculopathy, lumbar region - Plan: PT plan of care cert/re-cert  Sciatica, right side - Plan: PT plan of care cert/re-cert  Muscle weakness (generalized) - Plan: PT plan of care cert/re-cert     Problem List Patient Active Problem List   Diagnosis Date Noted   [place], RIGHT 03/20/2010   Ruben Im, PT 05/01/21 4:36 PM Phone: [phone] Fax: [phone]  Alvera Singh, PT 05/01/2021, 4:36 PM  [facility] @ [address] [place], [place], [address] Phone: [phone]   Fax:  [phone]  Name: NICO ROGNESS MRN: [id number] Date of Birth: 07-31-1950

## 2021-05-06 ENCOUNTER — Other Ambulatory Visit: Payer: Self-pay

## 2021-05-06 ENCOUNTER — Ambulatory Visit: Payer: PPO | Admitting: Physical Therapy

## 2021-05-06 DIAGNOSIS — M6281 Muscle weakness (generalized): Secondary | ICD-10-CM

## 2021-05-06 DIAGNOSIS — M5416 Radiculopathy, lumbar region: Secondary | ICD-10-CM

## 2021-05-06 DIAGNOSIS — M5431 Sciatica, right side: Secondary | ICD-10-CM

## 2021-05-06 NOTE — Therapy (Signed)
[facility] @ [address] [place], [place], [address] Phone: [phone]   Fax:  [phone]  Physical Therapy Treatment  Patient Details  Name: Renee Montgomery MRN: [id number] Date of Birth: 10-19-1950 Referring Provider (PT): Dr. Lujean Amel   Encounter Date: 05/06/2021   PT End of Session - 05/06/21 1051     Visit Number 13    Date for PT Re-Evaluation 06/12/21    Authorization Type Medicare 20thvisit progress note    PT Start Time 1015    PT Stop Time 1058    PT Time Calculation (min) 43 min    Activity Tolerance Patient tolerated treatment well             Past Medical History:  Diagnosis Date   Anxiety    Thyroid disease    Hypo    Past Surgical History:  Procedure Laterality Date   AUGMENTATION MAMMAPLASTY Bilateral    BREAST ENHANCEMENT SURGERY  2003   FOREARM FRACTURE SURGERY  7124   left   UMBILICAL HERNIA REPAIR  1990    There were no vitals filed for this visit.   Subjective Assessment - 05/06/21 1017     Subjective Used TENS all day and that helped.  Yesterday I did very well (Christmas dinner for family). Overall I've been a lot better.  That needling helped and it stretch better.  I took some Advil this morning.  Still    Pertinent History osteopenia; saw Dr. 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 Yes    Pain Score 2    back hurts later in the day   Pain Location Buttocks    Pain Orientation Right    Pain Type Chronic pain                               [facility] Adult PT Treatment/Exercise - 05/06/21 0001       Self-Care   Other Self-Care Comments  discussion of foraminal opening and effect on neural symptoms; used spinal model for visual      Lumbar  Exercises: Sidelying   Other Sidelying Lumbar Exercises vertebral foraminal opening over towel roll with right UE open books 10x      Electrical Stimulation   Electrical Stimulation Location right gluteals    Electrical Stimulation Action with DN    Electrical Stimulation Parameters 1.5 ma 8 min    Electrical Stimulation Goals Pain      Manual Therapy   Soft tissue mobilization Rt gluteals and right lumbar paraspinals              Trigger Point Dry Needling - 05/06/21 0001     Consent Given? Yes    Muscles Treated Back/Hip Lumbar multifidi;Gluteus medius;Gluteus minimus    Dry Needling Comments right    Other Dry Needling no lumbar "marination" 5 sec place and hold 5 sec no pistoning    Gluteus Minimus Response Twitch response elicited;Palpable increased muscle length    Gluteus Medius Response Twitch response elicited;Palpable increased muscle length    Piriformis Response Palpable increased muscle length    Lumbar multifidi Response Palpable increased muscle length;Twitch response elicited  PT Short Term Goals - 05/01/21 1633       PT SHORT TERM GOAL #1   Title The patient will demonstrate basic self care and ex's to relieve LE symptoms    Status Achieved      PT SHORT TERM GOAL #2   Title The patient will report a 30% reduction in right LE symptoms with walking medium distances    Status Achieved      PT SHORT TERM GOAL #3   Title The patient will have improved bil lumbo/pelvic/hip strength to grossly 4+/5 needed for standing to cook and ascend steps    Status Achieved               PT Long Term Goals - 05/01/21 1141       PT LONG TERM GOAL #1   Title The patient will be independent with safe self progression of HEP    Time 6    Period Weeks    Status On-going    Target Date 06/12/21      PT LONG TERM GOAL #2   Title The patient will report a 60% improvement in right LE symptoms with walking    Time 6    Period Weeks     Status On-going      PT LONG TERM GOAL #3   Title The patient will be able to walk her dog medium distances    Time 6    Period Weeks    Status On-going      PT LONG TERM GOAL #4   Title The patient will have 4+/5 to 5-/5 lumbo pelvic hip strength needed for lifting medium weight objects    Time 6    Period Weeks    Status On-going      PT LONG TERM GOAL #5   Title FOTO score improved to 62% indicating improved function with less pain    Time 6    Period Weeks    Status On-going                   Plan - 05/06/21 1201     Clinical Impression Statement The patient reports she was pleased with how she felt while hosting her family for Christmas yesterday.  She continues to have distal right LE symptoms to the foot but lower intensity that previous.  Peripheral symptoms worsened with lumbar extension including walking.  Trial of foraminal gapping with flexion, lateral gap and rotation on right.  She is receptive to DN of gluteals and piriformis with ES and short duration "non-marinating" of lumbar multifidi.  She reports this was helpful in symptom reduction last visit.  Decreased overall tender points noted compared to previous visits.  Therapist monitoring response throughout treatment session.    Examination-Activity Limitations Locomotion Level;Lift;Stand;Sleep    Examination-Participation Restrictions Meal Prep;Community Activity;Other    Rehab Potential Good    PT Frequency 2x / week    PT Duration 6 weeks    PT Treatment/Interventions ADLs/Self Care Home Management;Aquatic Therapy;Cryotherapy;Electrical Stimulation;Ultrasound;Traction;Moist Heat;Iontophoresis 4mg /ml Dexamethasone;Neuromuscular re-education;Therapeutic exercise;Therapeutic activities;Patient/family education;Manual techniques;Dry needling;Taping;Spinal Manipulations    PT Next Visit Plan assess response to right foraminal gapping;  DN with ES to glutes;  DN to lumbar multifidi no marinate;  seated rows;  seated lifting; quadruped ex    PT Home Exercise Plan Access Code: [email or web address]             Patient will benefit from skilled therapeutic intervention in order to improve the following deficits  and impairments:  Difficulty walking, Pain, Increased fascial restricitons, Impaired perceived functional ability, Decreased activity tolerance, Decreased strength  Visit Diagnosis: Radiculopathy, lumbar region  Sciatica, right side  Muscle weakness (generalized)     Problem List Patient Active Problem List   Diagnosis Date Noted   SHOULDER STRAIN, RIGHT 03/20/2010   Ruben Im, PT 05/06/21 12:09 PM Phone: [phone] Fax: [phone]  Alvera Singh, PT 05/06/2021, 12:08 PM  [facility] @ [address] [place], [place], [address] Phone: [phone]   Fax:  [phone]  Name: JAILEN LUNG MRN: [id number] Date of Birth: Jul 05, 1950

## 2021-05-13 ENCOUNTER — Ambulatory Visit: Payer: PPO | Attending: Family Medicine | Admitting: Physical Therapy

## 2021-05-13 ENCOUNTER — Other Ambulatory Visit: Payer: Self-pay

## 2021-05-13 DIAGNOSIS — M5416 Radiculopathy, lumbar region: Secondary | ICD-10-CM | POA: Diagnosis not present

## 2021-05-13 DIAGNOSIS — M5431 Sciatica, right side: Secondary | ICD-10-CM | POA: Insufficient documentation

## 2021-05-13 DIAGNOSIS — M6281 Muscle weakness (generalized): Secondary | ICD-10-CM | POA: Diagnosis not present

## 2021-05-13 NOTE — Therapy (Signed)
[facility] @ [address] [place], [place], [address] Phone: [phone]   Fax:  [phone]  Physical Therapy Treatment  Patient Details  Name: Renee Montgomery MRN: [id number] Date of Birth: 08-15-1950 Referring Provider (PT): Dr. Lujean Amel   Encounter Date: 05/13/2021   PT End of Session - 05/13/21 1212     Visit Number 14    Date for PT Re-Evaluation 06/12/21    Authorization Type Medicare 20thvisit progress note    PT Start Time 1015    PT Stop Time 1106    PT Time Calculation (min) 51 min    Activity Tolerance Patient tolerated treatment well             Past Medical History:  Diagnosis Date   Anxiety    Thyroid disease    Hypo    Past Surgical History:  Procedure Laterality Date   AUGMENTATION MAMMAPLASTY Bilateral    BREAST ENHANCEMENT SURGERY  2003   FOREARM FRACTURE SURGERY  6811   left   UMBILICAL HERNIA REPAIR  1990    There were no vitals filed for this visit.   Subjective Assessment - 05/13/21 1021     Subjective I'm better.  I walked yesterday and I still hurt but it wasn't as horrible as usual.  Sometimes I feel normal.  I did hurt after 1 1/2 hours of line dancing later.  Felt intensely sore after DN.   Opening exercise was helpful.  Been doing ex's seated with weight but they make my leg tingle.  But it goes right away.    Pertinent History osteopenia; saw Dr. 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 No/denies    Pain Score 0-No pain   around the house this morning 3 or 4 in right buttock                              [facility] Adult PT Treatment/Exercise - 05/13/21 0001       Lumbar Exercises: Sidelying   Other Sidelying Lumbar Exercises vertebral foraminal  opening over towel roll with right UE open books 10x    Other Sidelying Lumbar Exercises side planks bent knees on elbow 5x 5 sec holds on each side      Lumbar Exercises: Quadruped   Other Quadruped Lumbar Exercises primal push up hovers 5 sec hold 10x    Other Quadruped Lumbar Exercises 1/2 kneel on foam + cushion with UE movements 60 sec on each side      Moist Heat Therapy   Number Minutes Moist Heat 5 Minutes    Moist Heat Location Hip;Lumbar Spine      Manual Therapy   Soft tissue mobilization right glutes and right QL              Trigger Point Dry Needling - 05/13/21 0001     Consent Given? Yes    Dry Needling Comments right    Gluteus Minimus Response Twitch response elicited;Palpable increased muscle length    Gluteus Medius Response Twitch response elicited;Palpable increased muscle length    Gluteus Maximus Response Palpable increased muscle length    Quadratus Lumborum Response Palpable increased muscle length  PT Short Term Goals - 05/01/21 1633       PT SHORT TERM GOAL #1   Title The patient will demonstrate basic self care and ex's to relieve LE symptoms    Status Achieved      PT SHORT TERM GOAL #2   Title The patient will report a 30% reduction in right LE symptoms with walking medium distances    Status Achieved      PT SHORT TERM GOAL #3   Title The patient will have improved bil lumbo/pelvic/hip strength to grossly 4+/5 needed for standing to cook and ascend steps    Status Achieved               PT Long Term Goals - 05/01/21 1141       PT LONG TERM GOAL #1   Title The patient will be independent with safe self progression of HEP    Time 6    Period Weeks    Status On-going    Target Date 06/12/21      PT LONG TERM GOAL #2   Title The patient will report a 60% improvement in right LE symptoms with walking    Time 6    Period Weeks    Status On-going      PT LONG TERM GOAL #3   Title The patient  will be able to walk her dog medium distances    Time 6    Period Weeks    Status On-going      PT LONG TERM GOAL #4   Title The patient will have 4+/5 to 5-/5 lumbo pelvic hip strength needed for lifting medium weight objects    Time 6    Period Weeks    Status On-going      PT LONG TERM GOAL #5   Title FOTO score improved to 62% indicating improved function with less pain    Time 6    Period Weeks    Status On-going                   Plan - 05/13/21 1213     Clinical Impression Statement The patient states she was particularly sore following DN last session but feels the DN has been helpful for symptom reduction overall and she misses it when it's not done.  Added  strengthening ex's in unloaded and partially loaded positions with the production of LE paresthesia that quickly resolves.  She reports the ex's make her feel strong and feel good to her back despite being challenging.  Improved QL length and decreased tender points in glutes following treatment session.  Therapist very closely monitoring response during DN secondary to sensitivity.    Personal Factors and Comorbidities Comorbidity 1;Time since onset of injury/illness/exacerbation    Comorbidities osteopenia    Examination-Activity Limitations Locomotion Level;Lift;Stand;Sleep    Rehab Potential Good    PT Frequency 2x / week    PT Duration 6 weeks    PT Treatment/Interventions ADLs/Self Care Home Management;Aquatic Therapy;Cryotherapy;Electrical Stimulation;Ultrasound;Traction;Moist Heat;Iontophoresis 4mg /ml Dexamethasone;Neuromuscular re-education;Therapeutic exercise;Therapeutic activities;Patient/family education;Manual techniques;Dry needling;Taping;Spinal Manipulations    PT Next Visit Plan assess response to new ex's: quadruped hover, side plank and 1/2 kneel;  DN as needed; seated ex focus    PT Home Exercise Plan Access Code: [email or web address]             Patient will benefit from skilled therapeutic  intervention in order to improve the following deficits and impairments:  Difficulty walking, Pain, Increased fascial  restricitons, Impaired perceived functional ability, Decreased activity tolerance, Decreased strength  Visit Diagnosis: Radiculopathy, lumbar region  Sciatica, right side  Muscle weakness (generalized)     Problem List Patient Active Problem List   Diagnosis Date Noted   SHOULDER STRAIN, RIGHT 03/20/2010   Ruben Im, PT 05/13/21 12:33 PM Phone: [phone] Fax: [phone]  Alvera Singh, PT 05/13/2021, 12:33 PM  [facility] @ [address] [place], [place], [address] Phone: [phone]   Fax:  [phone]  Name: Renee Montgomery MRN: [id number] Date of Birth: 04-22-1951

## 2021-05-13 NOTE — Patient Instructions (Signed)
Access Code: [email or web address] URL: [email or web address] Date: 05/13/2021 Prepared by: Ruben Im  Exercises Supine Hip Internal and External Rotation - 1 x daily - 7 x weekly - 1 sets - 20 reps Standing Hip Flexor Stretch - 1 x daily - 7 x weekly - 1 sets - 2 reps - 20 hold Quadruped Rocking Backward - 1 x daily - 7 x weekly - 1 sets - 10 reps Supine Bent Leg Lift with Knee Extension - 1 x daily - 7 x weekly - 1 sets - 10 reps Clamshell with Resistance - 1 x daily - 7 x weekly - 1 sets - 10 reps Hooklying Clamshell with Resistance - 1 x daily - 7 x weekly - 1 sets - 10 reps Prone Hip Extension - One Pillow - 1 x daily - 7 x weekly - 1 sets - 5 reps Prone Scapular Retraction Arms at Side - 1 x daily - 7 x weekly - 1 sets - 5 reps Supine Peroneal Nerve Glide - 1 x daily - 7 x weekly - 1 sets - 5 reps Supine Sciatic Nerve Glide - 1 x daily - 7 x weekly - 1 sets - 5 reps doorway hip flexor stretch - 1 x daily - 7 x weekly - 1 sets - 10 reps Standing Row with Anchored Resistance Band with PLB - 1 x daily - 7 x weekly - 1 sets - 10 reps Standing High Row with Anchored Resistance - 1 x daily - 7 x weekly - 1 sets - 10 reps Shoulder Extension with Resistance Hands Down - 1 x daily - 7 x weekly - 1 sets - 10 reps Seated Slump Nerve Glide - 1 x daily - 7 x weekly - 1 sets - 10 reps Self Traction in Standing with Counter Top - 1 x daily - 7 x weekly - 1 sets - 3-5 reps - 20 hold SNATCH AND PRESS OVERHEAD - 1 x daily - 7 x weekly - 2 sets - 10 reps Primal Push Up - 1 x daily - 7 x weekly - 1 sets - 5 reps - 3-5 hold Side Plank on Knees - 1 x daily - 7 x weekly - 1 sets - 5 reps - 3-5 hold Half Kneeling on Foam Pad - 1 x daily - 7 x weekly - 3 sets - 10 reps Half Kneeling on Foam Pad - 1 x daily - 7 x weekly - 1 sets - 1 reps - 60 hold

## 2021-05-20 ENCOUNTER — Other Ambulatory Visit: Payer: Self-pay

## 2021-05-20 ENCOUNTER — Ambulatory Visit: Payer: PPO | Admitting: Physical Therapy

## 2021-05-20 DIAGNOSIS — M5416 Radiculopathy, lumbar region: Secondary | ICD-10-CM | POA: Diagnosis not present

## 2021-05-20 DIAGNOSIS — M5431 Sciatica, right side: Secondary | ICD-10-CM

## 2021-05-20 DIAGNOSIS — M6281 Muscle weakness (generalized): Secondary | ICD-10-CM

## 2021-05-20 NOTE — Therapy (Signed)
[facility] @ [address], [place], [address] Phone: [phone]   Fax:  [phone]  Physical Therapy Treatment  Patient Details  Name: Renee Montgomery MRN: [id number] Date of Birth: January 25, 1951 Referring Provider (PT): Dr. Lujean Amel   Encounter Date: 05/20/2021   PT End of Session - 05/20/21 1321     Visit Number 15    Date for PT Re-Evaluation 06/12/21    Authorization Type Medicare 20thvisit progress note    PT Start Time 1233    PT Stop Time 1324    PT Time Calculation (min) 51 min    Activity Tolerance Patient tolerated treatment well             Past Medical History:  Diagnosis Date   Anxiety    Thyroid disease    Hypo    Past Surgical History:  Procedure Laterality Date   AUGMENTATION MAMMAPLASTY Bilateral    BREAST ENHANCEMENT SURGERY  2003   FOREARM FRACTURE SURGERY  6979   left   UMBILICAL HERNIA REPAIR  1990    There were no vitals filed for this visit.   Subjective Assessment - 05/20/21 1238     Subjective Busy weekend.  I've been on a roller coaster.  Did fine Tuesday and Wednesday.  A little post needling soreness.  Thursday was a good day.  I hurt the whole time in line dancing class on Friday and I couldn't walk the dog.   Saturday I felt great.  Pretty good day Sunday but started hurting that evening after doing the exercises and I had an awful night with my leg hurting all night long.    Yesterday I had trouble walking the dog.  I'm better in the morning.  I have walked the dog 4x this week.  I do feel stronger in my back.    Pertinent History osteopenia; saw Dr. Saintclair Halsted and will return in early Dec    Limitations Walking;House hold activities    How long can you sit comfortably? 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 Yes  3/10   Pain Score --   my back hurt last night; I don't feel as strong in my back today   Pain Location Hip    Pain Relieving Factors rest; mornings                               [facility] Adult PT Treatment/Exercise - 05/20/21 0001       Lumbar Exercises: Seated   Other Seated Lumbar Exercises review of HEP and discussion on dividing them up, monitor of symptoms,hold on any that cause peripheralization      Moist Heat Therapy   Number Minutes Moist Heat 5 Minutes    Moist Heat Location Hip;Lumbar Spine      Manual Therapy   Manual therapy comments right piriformis contract/relax 3x 5 sec    Joint Mobilization right inferior mob, AP in internal rotation 3x 30 sec each; neutral gapping grade 3 3x    Soft tissue mobilization right glutes and right QL              Trigger Point Dry Needling - 05/20/21 0001  Consent Given? Yes    Dry Needling Comments right    Gluteus Minimus Response Twitch response elicited;Palpable increased muscle length    Gluteus Medius Response Twitch response elicited;Palpable increased muscle length    Gluteus Maximus Response Palpable increased muscle length                     PT Short Term Goals - 05/01/21 1633       PT SHORT TERM GOAL #1   Title The patient will demonstrate basic self care and ex's to relieve LE symptoms    Status Achieved      PT SHORT TERM GOAL #2   Title The patient will report a 30% reduction in right LE symptoms with walking medium distances    Status Achieved      PT SHORT TERM GOAL #3   Title The patient will have improved bil lumbo/pelvic/hip strength to grossly 4+/5 needed for standing to cook and ascend steps    Status Achieved               PT Long Term Goals - 05/01/21 1141       PT LONG TERM GOAL #1   Title The patient will be independent with safe self progression of HEP    Time 6    Period Weeks    Status On-going    Target  Date 06/12/21      PT LONG TERM GOAL #2   Title The patient will report a 60% improvement in right LE symptoms with walking    Time 6    Period Weeks    Status On-going      PT LONG TERM GOAL #3   Title The patient will be able to walk her dog medium distances    Time 6    Period Weeks    Status On-going      PT LONG TERM GOAL #4   Title The patient will have 4+/5 to 5-/5 lumbo pelvic hip strength needed for lifting medium weight objects    Time 6    Period Weeks    Status On-going      PT LONG TERM GOAL #5   Title FOTO score improved to 62% indicating improved function with less pain    Time 6    Period Weeks    Status On-going                   Plan - 05/20/21 2037     Clinical Impression Statement The patient has variable, up/down symptoms but notes 3 "good days" in a row.  She is highly sensitive to DN but feels this has been quite helpful in overall pain relief.  Discussed self monitoring to identify any particular ex's that may cause peripheralization and expectation for recovery of more good days vs. bad days in the coming weeks.  Good response to lumbo/pelvic and hip mobilizations.  Therapist very closely monitoring response  and modifying DN approach secondary to sensitivity.    Personal Factors and Comorbidities Comorbidity 1;Time since onset of injury/illness/exacerbation    Examination-Activity Limitations Locomotion Level;Lift;Stand;Sleep    Examination-Participation Restrictions Meal Prep;Community Activity;Other    Rehab Potential Good    PT Frequency 2x / week    PT Duration 6 weeks    PT Treatment/Interventions ADLs/Self Care Home Management;Aquatic Therapy;Cryotherapy;Electrical Stimulation;Ultrasound;Traction;Moist Heat;Iontophoresis 4mg /ml Dexamethasone;Neuromuscular re-education;Therapeutic exercise;Therapeutic activities;Patient/family education;Manual techniques;Dry needling;Taping;Spinal Manipulations    PT Next Visit Plan hip mobs; neutral  gapping;  quadruped  hover, side plank and 1/2 kneel;  DN as needed; seated ex focus    PT Home Exercise Plan Access Code: [email or web address]             Patient will benefit from skilled therapeutic intervention in order to improve the following deficits and impairments:  Difficulty walking, Pain, Increased fascial restricitons, Impaired perceived functional ability, Decreased activity tolerance, Decreased strength  Visit Diagnosis: Radiculopathy, lumbar region  Sciatica, right side  Muscle weakness (generalized)     Problem List Patient Active Problem List   Diagnosis Date Noted   Lithium, RIGHT 03/20/2010   Ruben Im, PT 05/20/21 8:45 PM Phone: [phone] Fax: [phone]  Alvera Singh, PT 05/20/2021, 8:44 PM  [facility] @ [address] [place], [place], [address] Phone: [phone]   Fax:  [phone]  Name: SAYA MCCOLL MRN: [id number] Date of Birth: 07/10/1950

## 2021-05-27 ENCOUNTER — Other Ambulatory Visit: Payer: Self-pay

## 2021-05-27 ENCOUNTER — Ambulatory Visit: Payer: PPO | Admitting: Physical Therapy

## 2021-05-27 DIAGNOSIS — M5416 Radiculopathy, lumbar region: Secondary | ICD-10-CM | POA: Diagnosis not present

## 2021-05-27 DIAGNOSIS — M5431 Sciatica, right side: Secondary | ICD-10-CM

## 2021-05-27 DIAGNOSIS — M6281 Muscle weakness (generalized): Secondary | ICD-10-CM

## 2021-05-27 NOTE — Therapy (Signed)
[facility] @ [address] [place], [place], [address] Phone: [phone]   Fax:  [phone]  Physical Therapy Treatment  Patient Details  Name: Renee Montgomery MRN: [id number] Date of Birth: 1950/08/17 Referring Provider (PT): Dr. Lujean Amel   Encounter Date: 05/27/2021   PT End of Session - 05/27/21 1734     Visit Number 16    Date for PT Re-Evaluation 06/12/21    Authorization Type Medicare 20thvisit progress note    PT Start Time 1015    PT Stop Time 1100    PT Time Calculation (min) 45 min    Activity Tolerance Patient tolerated treatment well             Past Medical History:  Diagnosis Date   Anxiety    Thyroid disease    Hypo    Past Surgical History:  Procedure Laterality Date   AUGMENTATION MAMMAPLASTY Bilateral    BREAST ENHANCEMENT SURGERY  2003   FOREARM FRACTURE SURGERY  8850   left   UMBILICAL HERNIA REPAIR  1990    There were no vitals filed for this visit.   Subjective Assessment - 05/27/21 1021     Subjective I feel good.  Still highs and lows.  But I do have highs so that's good.  Didn't go to like dancing Friday.  Had a good weekend.  Yesterday the sciatica kicked in.   I did some ex's and my back got so much better.  Some ex's I can't do.  I have that ball in my back.    Pertinent History osteopenia; saw Dr. Saintclair Halsted and will return in early Dec    How long can you stand comfortably? 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 Yes    Pain Score 3    no back pain   Pain Location Buttocks    Pain Type Chronic pain                               [facility] Adult PT Treatment/Exercise - 05/27/21 0001       Lumbar Exercises: Stretches   Other Lumbar Stretch  Exercise seated green ball rollouts forward and with bias side to side      Lumbar Exercises: Seated   Other Seated Lumbar Exercises sitting on green ball: 3# overhead press 10x right/left    Other Seated Lumbar Exercises sitting on green ball 5# kettlebell chops and around the worlds, ear to ear      Manual Therapy   Manual therapy comments right piriformis contract/relax 3x 5 sec    Joint Mobilization right inferior mob, AP in internal rotation 3x 30 sec each; neutral gapping grade 3 3x    Soft tissue mobilization right glutes and right lumbar paraspinals              Trigger Point Dry Needling - 05/27/21 0001     Consent Given? Yes    Dry Needling Comments right    Other Dry Needling sidelying right paraspinal threading    Gluteus Minimus Response Twitch response elicited;Palpable increased muscle length    Gluteus Medius Response Twitch response elicited;Palpable increased muscle length  Gluteus Maximus Response Palpable increased muscle length                     PT Short Term Goals - 05/01/21 1633       PT SHORT TERM GOAL #1   Title The patient will demonstrate basic self care and ex's to relieve LE symptoms    Status Achieved      PT SHORT TERM GOAL #2   Title The patient will report a 30% reduction in right LE symptoms with walking medium distances    Status Achieved      PT SHORT TERM GOAL #3   Title The patient will have improved bil lumbo/pelvic/hip strength to grossly 4+/5 needed for standing to cook and ascend steps    Status Achieved               PT Long Term Goals - 05/01/21 1141       PT LONG TERM GOAL #1   Title The patient will be independent with safe self progression of HEP    Time 6    Period Weeks    Status On-going    Target Date 06/12/21      PT LONG TERM GOAL #2   Title The patient will report a 60% improvement in right LE symptoms with walking    Time 6    Period Weeks    Status On-going      PT LONG TERM GOAL  #3   Title The patient will be able to walk her dog medium distances    Time 6    Period Weeks    Status On-going      PT LONG TERM GOAL #4   Title The patient will have 4+/5 to 5-/5 lumbo pelvic hip strength needed for lifting medium weight objects    Time 6    Period Weeks    Status On-going      PT LONG TERM GOAL #5   Title FOTO score improved to 62% indicating improved function with less pain    Time 6    Period Weeks    Status On-going                   Plan - 05/27/21 1734     Clinical Impression Statement The patient has a positive response to core ex's seated on the ball without production of peripheral symptoms.  She also reports a good stretch with seated ball stretches with bias.  She tolerated DN and manual therapy well with improved soft tissue and joint mobility noted in hip and lumbar spinal.  Therapist closely monitoring response with all interventions.    Personal Factors and Comorbidities Comorbidity 1;Time since onset of injury/illness/exacerbation    Comorbidities osteopenia    Examination-Activity Limitations Locomotion Level;Lift;Stand;Sleep    Rehab Potential Good    PT Frequency 2x / week    PT Duration 6 weeks    PT Treatment/Interventions ADLs/Self Care Home Management;Aquatic Therapy;Cryotherapy;Electrical Stimulation;Ultrasound;Traction;Moist Heat;Iontophoresis 4mg /ml Dexamethasone;Neuromuscular re-education;Therapeutic exercise;Therapeutic activities;Patient/family education;Manual techniques;Dry needling;Taping;Spinal Manipulations    PT Next Visit Plan hip mobs; neutral gapping;  DN as needed; seated ex focus on green ball    PT Home Exercise Plan Access Code: [email or web address]             Patient will benefit from skilled therapeutic intervention in order to improve the following deficits and impairments:  Difficulty walking, Pain, Increased fascial restricitons, Impaired perceived functional ability, Decreased activity tolerance, Decreased  strength  Visit Diagnosis: Radiculopathy, lumbar region  Sciatica, right side  Muscle weakness (generalized)     Problem List Patient Active Problem List   Diagnosis Date Noted   SHOULDER STRAIN, RIGHT 03/20/2010   Ruben Im, PT 05/27/21 5:42 PM Phone: [phone] Fax: [phone]  Alvera Singh, PT 05/27/2021, 5:41 PM  [facility] @ [facility] [place] [place], [place], [address] Phone: [phone]   Fax:  [phone]  Name: Renee Montgomery MRN: [id number] Date of Birth: November 01, 1950

## 2021-06-03 ENCOUNTER — Other Ambulatory Visit: Payer: Self-pay

## 2021-06-03 ENCOUNTER — Ambulatory Visit: Payer: PPO | Admitting: Physical Therapy

## 2021-06-03 DIAGNOSIS — M5431 Sciatica, right side: Secondary | ICD-10-CM

## 2021-06-03 DIAGNOSIS — M6281 Muscle weakness (generalized): Secondary | ICD-10-CM

## 2021-06-03 DIAGNOSIS — M5416 Radiculopathy, lumbar region: Secondary | ICD-10-CM | POA: Diagnosis not present

## 2021-06-03 NOTE — Therapy (Signed)
[facility] @ [place], [place], [address] Phone: [phone]   Fax:  [phone]  Physical Therapy Treatment  Patient Details  Name: Renee Montgomery MRN: [id number] Date of Birth: 06-08-1950 Referring Provider (PT): Dr. Lujean Amel   Encounter Date: 06/03/2021   PT End of Session - 06/03/21 1349     Visit Number 17    Date for PT Re-Evaluation 06/12/21    Authorization Type Medicare 20thvisit progress note    PT Start Time 1147    PT Stop Time 1235    PT Time Calculation (min) 48 min    Activity Tolerance Patient tolerated treatment well             Past Medical History:  Diagnosis Date   Anxiety    Thyroid disease    Hypo    Past Surgical History:  Procedure Laterality Date   AUGMENTATION MAMMAPLASTY Bilateral    BREAST ENHANCEMENT SURGERY  2003   FOREARM FRACTURE SURGERY  5956   left   UMBILICAL HERNIA REPAIR  1990    There were no vitals filed for this visit.   Subjective Assessment - 06/03/21 1150     Subjective I've been better this week.  I've been doing more vigorous ex's.  Did some yoga videos.  I did aerobic videos.  Had 1 time of leg numbness but then it resolved.  I exercised more.  I want you to needle me.  I felt a lot of twitching all afternoon.  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  I feel 2 inches taller.    Pertinent History osteopenia; saw Dr. Saintclair Halsted and will return in early Dec    Limitations Walking;House hold activities    How long can you sit comfortably? 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 Yes    Pain Score 2    right lateral leg pain   Pain Location Buttocks   less frequent and severe   Pain Orientation Right    Pain Type  Chronic pain                               [facility] Adult PT Treatment/Exercise - 06/03/21 0001       Lumbar Exercises: Stretches   Other Lumbar Stretch Exercise seated green ball rollouts forward and with bias side to side    Other Lumbar Stretch Exercise supine ball pirformis stretch with ball 2x 20 sec      Lumbar Exercises: Seated   Other Seated Lumbar Exercises sitting on green ball marching 10x      Moist Heat Therapy   Number Minutes Moist Heat 5 Minutes    Moist Heat Location Hip;Lumbar Spine      Manual Therapy   Manual therapy comments right piriformis contract/relax 3x 5 sec    Joint Mobilization 3x 30 sec each; neutral gapping grade 3 3x    Soft tissue mobilization right glutes and right lumbar paraspinals              Trigger Point Dry Needling - 06/03/21 0001     Consent Given? Yes    Dry Needling Comments right  Other Dry Needling sidelying right paraspinal threading    Gluteus Minimus Response Twitch response elicited;Palpable increased muscle length    Gluteus Medius Response Twitch response elicited;Palpable increased muscle length    Gluteus Maximus Response Palpable increased muscle length                     PT Short Term Goals - 05/01/21 1633       PT SHORT TERM GOAL #1   Title The patient will demonstrate basic self care and ex's to relieve LE symptoms    Status Achieved      PT SHORT TERM GOAL #2   Title The patient will report a 30% reduction in right LE symptoms with walking medium distances    Status Achieved      PT SHORT TERM GOAL #3   Title The patient will have improved bil lumbo/pelvic/hip strength to grossly 4+/5 needed for standing to cook and ascend steps    Status Achieved               PT Long Term Goals - 05/01/21 1141       PT LONG TERM GOAL #1   Title The patient will be independent with safe self progression of HEP    Time 6    Period Weeks    Status On-going    Target Date  06/12/21      PT LONG TERM GOAL #2   Title The patient will report a 60% improvement in right LE symptoms with walking    Time 6    Period Weeks    Status On-going      PT LONG TERM GOAL #3   Title The patient will be able to walk her dog medium distances    Time 6    Period Weeks    Status On-going      PT LONG TERM GOAL #4   Title The patient will have 4+/5 to 5-/5 lumbo pelvic hip strength needed for lifting medium weight objects    Time 6    Period Weeks    Status On-going      PT LONG TERM GOAL #5   Title FOTO score improved to 62% indicating improved function with less pain    Time 6    Period Weeks    Status On-going                   Plan - 06/03/21 1350     Clinical Impression Statement The patient reports a good week with less pain intensity and production of symptoms.  She states she feels like she is standing much more erect and even her coworkers noticed.  She was able to complete several ex sessions successfully.   Although the DN is painful, she credits this along with manual therapy as being very beneficial.  Overall number of tender points decreased although still present in right piriformis and right paraspinals at L1-2.  Therapist monitoring response throughout treatment session and using modifications in DN technique to improve tolerance.    Personal Factors and Comorbidities Comorbidity 1;Time since onset of injury/illness/exacerbation    Comorbidities osteopenia    Examination-Activity Limitations Locomotion Level;Lift;Stand;Sleep    Examination-Participation Restrictions Meal Prep;Community Activity;Other    Stability/Clinical Decision Making Stable/Uncomplicated    PT Frequency 2x / week    PT Duration 6 weeks    PT Treatment/Interventions ADLs/Self Care Home Management;Aquatic Therapy;Cryotherapy;Electrical Stimulation;Ultrasound;Traction;Moist Heat;Iontophoresis 4mg /ml Dexamethasone;Neuromuscular re-education;Therapeutic exercise;Therapeutic  activities;Patient/family education;Manual techniques;Dry needling;Taping;Spinal  Manipulations    PT Next Visit Plan hip mobs; neutral gapping;  DN as needed; seated ex focus on green ball    PT Home Exercise Plan Access Code: [email or web address]             Patient will benefit from skilled therapeutic intervention in order to improve the following deficits and impairments:  Difficulty walking, Pain, Increased fascial restricitons, Impaired perceived functional ability, Decreased activity tolerance, Decreased strength  Visit Diagnosis: Radiculopathy, lumbar region  Sciatica, right side  Muscle weakness (generalized)     Problem List Patient Active Problem List   Diagnosis Date Noted   [place], RIGHT 03/20/2010   Ruben Im, PT 06/03/21 1:55 PM Phone: [phone] Fax: [phone]  Alvera Singh, PT 06/03/2021, 1:54 PM  [facility] @ [place], [place], [address] Phone: [phone]   Fax:  [phone]  Name: DELAILA NAND MRN: [id number] Date of Birth: 1950/12/03

## 2021-06-10 ENCOUNTER — Ambulatory Visit: Payer: PPO | Admitting: Physical Therapy

## 2021-06-10 ENCOUNTER — Other Ambulatory Visit: Payer: Self-pay

## 2021-06-10 DIAGNOSIS — M5416 Radiculopathy, lumbar region: Secondary | ICD-10-CM | POA: Diagnosis not present

## 2021-06-10 DIAGNOSIS — M5431 Sciatica, right side: Secondary | ICD-10-CM

## 2021-06-10 DIAGNOSIS — M6281 Muscle weakness (generalized): Secondary | ICD-10-CM

## 2021-06-10 NOTE — Therapy (Signed)
[facility] @ [place], [place], [address] Phone: [phone]   Fax:  [phone]  Physical Therapy Treatment/Recertification   Patient Details  Name: Renee Montgomery MRN: [id number] Date of Birth: 1951/04/13 Referring Provider (PT): Dr. Lujean Amel   Encounter Date: 06/10/2021   PT End of Session - 06/10/21 1731     Visit Number 18    Date for PT Re-Evaluation 09/02/21    Authorization Type Medicare 20thvisit progress note    PT Start Time 1016    PT Stop Time 1100    PT Time Calculation (min) 44 min    Activity Tolerance Patient tolerated treatment well             Past Medical History:  Diagnosis Date   Anxiety    Thyroid disease    Hypo    Past Surgical History:  Procedure Laterality Date   AUGMENTATION MAMMAPLASTY Bilateral    BREAST ENHANCEMENT SURGERY  2003   FOREARM FRACTURE SURGERY  8921   left   UMBILICAL HERNIA REPAIR  1990    There were no vitals filed for this visit.   Subjective Assessment - 06/10/21 [place];  more aggressive ex but I suffer every evening.  After DN I did really well and walked the dog, felt great that day but the next day I had to be a couch potato.  Had a hard time sleeping last night with back and right LE tingling.  Vacumning is painful.  I can get up and down off the floor a lot easier than I could before.    Pertinent History osteopenia; saw Dr. Saintclair Halsted and will return in early Dec    How long can you walk comfortably? 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 Yes    Pain Score 4     Pain Location Buttocks    Pain Orientation Right    Pain Type Chronic pain    Pain Relieving Factors massager is helpul to buttock region                 [facility] PT Assessment - 06/10/21 0001       Observation/Other Assessments   Focus on Therapeutic Outcomes (FOTO)  65%      AROM   Lumbar Flexion 70    Lumbar Extension 40    Lumbar - Right Side Bend 40    Lumbar - Left Side Bend 40      Strength   Overall Strength Comments improved SLS to 12 sec    Right Hip Extension 4+/5    Right Hip ABduction 4+/5    Left Hip Extension 4+/5    Left Hip ABduction 4+/5    Lumbar Flexion 4/5    Lumbar Extension 4/5                           [facility] Adult PT Treatment/Exercise - 06/10/21 0001       Lumbar Exercises: Standing   Other Standing Lumbar Exercises hip hinge with golf club 10x    Other Standing Lumbar Exercises use of hip hinge with vacumning simulation      Knee/Hip Exercises: Standing  SLS right/left >12 sec      Manual Therapy   Joint Mobilization 3x 30 sec each; neutral gapping grade 3 3x    Soft tissue mobilization right glutes and piriformis              Trigger Point Dry Needling - 06/10/21 0001     Consent Given? Yes    Dry Needling Comments right    Other Dry Needling sidelying right paraspinal threading    Gluteus Minimus Response Twitch response elicited;Palpable increased muscle length    Gluteus Medius Response Twitch response elicited;Palpable increased muscle length    Gluteus Maximus Response Palpable increased muscle length    Piriformis Response Palpable increased muscle length                     PT Short Term Goals - 06/10/21 1747       PT SHORT TERM GOAL #1   Title The patient will demonstrate basic self care and ex's to relieve LE symptoms    Status Achieved      PT SHORT TERM GOAL #2   Title The patient will report a 30% reduction in right LE symptoms with walking medium distances    Status Achieved      PT SHORT TERM GOAL #3   Title The patient will have improved bil lumbo/pelvic/hip strength to grossly 4+/5 needed for standing to cook and ascend  steps    Status Achieved               PT Long Term Goals - 06/10/21 1745       PT LONG TERM GOAL #1   Title The patient will be independent with safe self progression of HEP    Time 12    Period Weeks    Status On-going    Target Date 09/02/21      PT LONG TERM GOAL #2   Title The patient will report a 70% improvement in right LE symptoms with walking    Time 12    Period Weeks    Status Revised      PT LONG TERM GOAL #3   Title The patient will be able to walk her dog medium distances    Time 12    Period Weeks    Status On-going      PT LONG TERM GOAL #4   Title The patient will have 4+/5 to 5-/5 lumbo pelvic hip strength needed for lifting medium weight objects    Time 12    Period Weeks    Status On-going      PT LONG TERM GOAL #5   Title FOTO score improved to 69% indicating improved function with less pain    Time 12    Period Weeks    Status Revised                   Plan - 06/10/21 1738     Clinical Impression Statement The patient reports overall improvements in pain intensity and return to function.  She has good days and bad days but is able to do a higher level of exercise now and is able to walk about 1/2 mile.  She reports improvements in lumbar and hip mobility with greater ease getting up and down off the floor.  Her FOTO outcome score has improved from 53% to 65%.  Improving lumbo pelvic and hip strength to grossly 4/5.  Single leg stance stability is nearly symmetrical and WNLs >  10 sec.  She continues to have intermittent right radiating pain, numbness and tingling and pain in her buttock and back.  She has too much pain to walk more than 1/2 mile.  She would benefit from a tapering of visits spread out over the next 12 weeks for manual therapy, DN and progression of exercises.    Personal Factors and Comorbidities Comorbidity 1;Time since onset of injury/illness/exacerbation    Comorbidities osteopenia    Examination-Activity  Limitations Locomotion Level;Lift;Stand;Sleep    Examination-Participation Restrictions Meal Prep;Community Activity;Other    Rehab Potential Good    PT Frequency Biweekly    PT Duration 12 weeks    PT Treatment/Interventions ADLs/Self Care Home Management;Aquatic Therapy;Cryotherapy;Electrical Stimulation;Ultrasound;Traction;Moist Heat;Iontophoresis 4mg /ml Dexamethasone;Neuromuscular re-education;Therapeutic exercise;Therapeutic activities;Patient/family education;Manual techniques;Dry needling;Taping;Spinal Manipulations    PT Next Visit Plan hip mobs; neutral gapping;  DN as needed; seated ex focus on green ball;hip hinging    PT Home Exercise Plan Access Code: [email or web address]             Patient will benefit from skilled therapeutic intervention in order to improve the following deficits and impairments:  Difficulty walking, Pain, Increased fascial restricitons, Impaired perceived functional ability, Decreased activity tolerance, Decreased strength  Visit Diagnosis: Radiculopathy, lumbar region - Plan: PT plan of care cert/re-cert, CANCELED: PT plan of care cert/re-cert  Sciatica, right side - Plan: PT plan of care cert/re-cert, CANCELED: PT plan of care cert/re-cert  Muscle weakness (generalized) - Plan: PT plan of care cert/re-cert, CANCELED: PT plan of care cert/re-cert     Problem List Patient Active Problem List   Diagnosis Date Noted   [place], RIGHT 03/20/2010   Ruben Im, PT 06/10/21 5:50 PM Phone: [phone] Fax: [phone]  Alvera Singh, PT 06/10/2021, 5:49 PM  [facility] @ [place], [place], [address] Phone: [phone]   Fax:  [phone]  Name: DELAINIE CHAVANA MRN: [id number] Date of Birth: 09-05-1950

## 2021-06-12 ENCOUNTER — Other Ambulatory Visit (HOSPITAL_COMMUNITY): Payer: Self-pay

## 2021-06-13 ENCOUNTER — Other Ambulatory Visit (HOSPITAL_COMMUNITY): Payer: Self-pay

## 2021-06-13 MED ORDER — ALPRAZOLAM 0.25 MG PO TABS
ORAL_TABLET | ORAL | 0 refills | Status: AC
  Filled 2021-06-13 – 2021-06-20 (×2): qty 90, 90d supply, fill #0

## 2021-06-13 MED ORDER — ZOLPIDEM TARTRATE 10 MG PO TABS
ORAL_TABLET | ORAL | 0 refills | Status: AC
  Filled 2021-06-13 – 2021-06-20 (×2): qty 90, 90d supply, fill #0

## 2021-06-19 DIAGNOSIS — M5136 Other intervertebral disc degeneration, lumbar region: Secondary | ICD-10-CM | POA: Diagnosis not present

## 2021-06-19 DIAGNOSIS — M9905 Segmental and somatic dysfunction of pelvic region: Secondary | ICD-10-CM | POA: Diagnosis not present

## 2021-06-19 DIAGNOSIS — M9903 Segmental and somatic dysfunction of lumbar region: Secondary | ICD-10-CM | POA: Diagnosis not present

## 2021-06-19 DIAGNOSIS — M9904 Segmental and somatic dysfunction of sacral region: Secondary | ICD-10-CM | POA: Diagnosis not present

## 2021-06-20 ENCOUNTER — Other Ambulatory Visit (HOSPITAL_COMMUNITY): Payer: Self-pay

## 2021-06-20 MED ORDER — NEOMYCIN-POLYMYXIN-DEXAMETH 3.5-10000-0.1 OP SUSP
1.0000 [drp] | Freq: Four times a day (QID) | OPHTHALMIC | 0 refills | Status: AC
  Filled 2021-06-20: qty 5, 7d supply, fill #0

## 2021-06-24 ENCOUNTER — Ambulatory Visit: Payer: PPO | Attending: Family Medicine | Admitting: Physical Therapy

## 2021-06-24 ENCOUNTER — Other Ambulatory Visit: Payer: Self-pay

## 2021-06-24 DIAGNOSIS — M6281 Muscle weakness (generalized): Secondary | ICD-10-CM | POA: Insufficient documentation

## 2021-06-24 DIAGNOSIS — M5416 Radiculopathy, lumbar region: Secondary | ICD-10-CM | POA: Insufficient documentation

## 2021-06-24 DIAGNOSIS — M5431 Sciatica, right side: Secondary | ICD-10-CM | POA: Insufficient documentation

## 2021-06-24 NOTE — Therapy (Signed)
[facility] @ [place], [place], [address] Phone: [phone]   Fax:  [phone]  Physical Therapy Treatment  Patient Details  Name: Renee Montgomery MRN: [id number] Date of Birth: 1951-03-06 Referring Provider (PT): Dr. Lujean Amel   Encounter Date: 06/24/2021   PT End of Session - 06/24/21 1308     Visit Number 19    Date for PT Re-Evaluation 09/02/21    Authorization Type Medicare 20thvisit progress note    PT Start Time 1146    PT Stop Time 1275   DN heat   PT Time Calculation (min) 49 min    Activity Tolerance Patient tolerated treatment well             Past Medical History:  Diagnosis Date   Anxiety    Thyroid disease    Hypo    Past Surgical History:  Procedure Laterality Date   AUGMENTATION MAMMAPLASTY Bilateral    BREAST ENHANCEMENT SURGERY  2003   FOREARM FRACTURE SURGERY  1700   left   UMBILICAL HERNIA REPAIR  1990    There were no vitals filed for this visit.   Subjective Assessment - 06/24/21 1148     Subjective I've gone down hill this last week.  A lot more buttock pain and pain down the leg.  I've been so miserable.  I had a hard time walking the dog.  There is a place in the back hurting.  I went to the chiropractor and that helped for a day.  It hurts with sitting down.    Pertinent History osteopenia; saw Dr. Saintclair Halsted and will return in early Dec    Currently in Pain? Yes    Pain Location Back    Pain Orientation Right    Pain Radiating Towards right leg to foot                               [facility] Adult PT Treatment/Exercise - 06/24/21 0001       Moist Heat Therapy   Number Minutes Moist Heat 5 Minutes    Moist Heat Location Hip;Lumbar Spine      Manual Therapy   Manual therapy comments right piriformis contract/relax 3x 5 sec    Joint Mobilization 3x 30 sec each; neutral gapping grade 3 3x; long axis distraction, inferior mob    Soft tissue  mobilization right glutes and right lumbar paraspinals    Manual Traction 30 sec oscillations 3x              Trigger Point Dry Needling - 06/24/21 0001     Consent Given? Yes    Dry Needling Comments right    Other Dry Needling sidelying right paraspinal threading    Gluteus Minimus Response Twitch response elicited;Palpable increased muscle length    Gluteus Medius Response Twitch response elicited;Palpable increased muscle length    Gluteus Maximus Response Palpable increased muscle length    Piriformis Response Palpable increased muscle length                     PT Short Term Goals - 06/10/21 1747       PT SHORT TERM GOAL #1   Title The patient will demonstrate basic self care and ex's to relieve LE symptoms    Status Achieved      PT SHORT TERM GOAL #2   Title The patient will report  a 30% reduction in right LE symptoms with walking medium distances    Status Achieved      PT SHORT TERM GOAL #3   Title The patient will have improved bil lumbo/pelvic/hip strength to grossly 4+/5 needed for standing to cook and ascend steps    Status Achieved               PT Long Term Goals - 06/10/21 1745       PT LONG TERM GOAL #1   Title The patient will be independent with safe self progression of HEP    Time 12    Period Weeks    Status On-going    Target Date 09/02/21      PT LONG TERM GOAL #2   Title The patient will report a 70% improvement in right LE symptoms with walking    Time 12    Period Weeks    Status Revised      PT LONG TERM GOAL #3   Title The patient will be able to walk her dog medium distances    Time 12    Period Weeks    Status On-going      PT LONG TERM GOAL #4   Title The patient will have 4+/5 to 5-/5 lumbo pelvic hip strength needed for lifting medium weight objects    Time 12    Period Weeks    Status On-going      PT LONG TERM GOAL #5   Title FOTO score improved to 69% indicating improved function with less pain     Time 12    Period Weeks    Status Revised                   Plan - 06/24/21 1741     Clinical Impression Statement The patient called to request an earlier scheduled appt secondary to an exacerbation of symptoms this past week.  She continues to be highly sensitive to DN but feels this has been helpful in relieving symptoms in prior visits.  Due to sensitivity continued with "non-marinating" technique with DN.  Good response as well to lumbar gapping/distraction and hip mobilization techniques.  Much improved soft tissue and joint mobility at the end of treatment session.    Personal Factors and Comorbidities Comorbidity 1;Time since onset of injury/illness/exacerbation    Rehab Potential Good    PT Frequency Biweekly    PT Duration 12 weeks    PT Treatment/Interventions ADLs/Self Care Home Management;Aquatic Therapy;Cryotherapy;Electrical Stimulation;Ultrasound;Traction;Moist Heat;Iontophoresis 4mg /ml Dexamethasone;Neuromuscular re-education;Therapeutic exercise;Therapeutic activities;Patient/family education;Manual techniques;Dry needling;Taping;Spinal Manipulations    PT Next Visit Plan 20th visit progress note;  may need to increase visit frequency secondary to symptom flare up;  DN; lumbar and hip mobilizations    PT Home Exercise Plan Access Code: [email or web address]             Patient will benefit from skilled therapeutic intervention in order to improve the following deficits and impairments:  Difficulty walking, Pain, Increased fascial restricitons, Impaired perceived functional ability, Decreased activity tolerance, Decreased strength  Visit Diagnosis: Radiculopathy, lumbar region  Sciatica, right side  Muscle weakness (generalized)     Problem List Patient Active Problem List   Diagnosis Date Noted   [place], RIGHT 03/20/2010   Ruben Im, PT 06/24/21 5:46 PM Phone: [phone] Fax: [phone]  Alvera Singh, PT 06/24/2021, 5:46 PM  [facility] @ [place], [place], [address] Phone: [phone]  Fax:  [phone]  Name: Renee Montgomery MRN: [id number] Date of Birth: 02/14/1951

## 2021-06-25 ENCOUNTER — Other Ambulatory Visit (HOSPITAL_COMMUNITY): Payer: Self-pay

## 2021-06-26 ENCOUNTER — Encounter: Payer: PPO | Admitting: Physical Therapy

## 2021-06-27 ENCOUNTER — Other Ambulatory Visit (HOSPITAL_COMMUNITY): Payer: Self-pay

## 2021-07-01 ENCOUNTER — Other Ambulatory Visit: Payer: Self-pay

## 2021-07-01 ENCOUNTER — Ambulatory Visit: Payer: PPO | Admitting: Physical Therapy

## 2021-07-01 DIAGNOSIS — M5416 Radiculopathy, lumbar region: Secondary | ICD-10-CM | POA: Diagnosis not present

## 2021-07-01 DIAGNOSIS — M5431 Sciatica, right side: Secondary | ICD-10-CM

## 2021-07-01 DIAGNOSIS — M6281 Muscle weakness (generalized): Secondary | ICD-10-CM

## 2021-07-01 NOTE — Therapy (Signed)
[name] @ [facility] [place] [place], [place], [address] Phone: [phone]   Fax:  [phone]  Physical Therapy Treatment  Patient Details  Name: Renee Montgomery MRN: [id number] Date of Birth: [birth year] Referring Provider (PT): Dr. Lujean Amel  Progress Note Reporting Period 04/15/2021 to 07/01/2021  See note below for Objective Data and Assessment of Progress/Goals.     Encounter Date: 07/01/2021   PT End of Session - 07/01/21 1239     Visit Number 20    Date for PT Re-Evaluation 09/02/21    Authorization Type Medicare 20thvisit progress note    PT Start Time 1145    PT Stop Time 1225   DN   PT Time Calculation (min) 40 min    Activity Tolerance Patient tolerated treatment well             Past Medical History:  Diagnosis Date   Anxiety    Thyroid disease    Hypo    Past Surgical History:  Procedure Laterality Date   AUGMENTATION MAMMAPLASTY Bilateral    BREAST ENHANCEMENT SURGERY  2003   FOREARM FRACTURE SURGERY  2025   left   UMBILICAL HERNIA REPAIR  1990    There were no vitals filed for this visit.   Subjective Assessment - 07/01/21 1150     Subjective I was better for 3 days and then it started back up.  I've walked the dog but painfully.  My pain has changed a little.  Starts in buttock and lower leg.  I feel it sitting sometimes.  Yesterday my right leg felt weak.   I made an appt with Dr. Saintclair Halsted next Thursday.  I do feel better than when I first came.    Pertinent History osteopenia; saw Dr. Saintclair Halsted and will return in early Dec    Patient Stated Goals I don't want to hurt; walk dog; get back to travel tours; stand in airports    Currently in Pain? Yes 6/10   Pain Location Back, buttock, lower leg, top of foot               [facility] PT Assessment - 07/01/21 0001       Observation/Other Assessments   Focus on Therapeutic Outcomes (FOTO)  65%      AROM   Lumbar Flexion 70    Lumbar  Extension 40    Lumbar - Right Side Bend 40    Lumbar - Left Side Bend 40      Strength   Overall Strength Comments improved SLS to 12 sec    Right Hip Extension 4+/5    Right Hip ABduction 4+/5    Left Hip Extension 4+/5    Left Hip ABduction 4+/5    Lumbar Flexion 4/5    Lumbar Extension 4/5                           [facility] Adult PT Treatment/Exercise - 07/01/21 0001       Manual Therapy   Manual therapy comments right piriformis contract/relax 3x 5 sec    Joint Mobilization 3x 30 sec each; neutral gapping grade 3 3x; long axis distraction, inferior mob    Soft tissue mobilization right glutes and right lumbar paraspinals    Manual Traction 30 sec oscillations 3x              Trigger Point Dry Needling - 07/01/21 0001  Consent Given? Yes    Dry Needling Comments right    Other Dry Needling sidelying right paraspinal threading    Gluteus Minimus Response Twitch response elicited;Palpable increased muscle length    Gluteus Medius Response Twitch response elicited;Palpable increased muscle length    Gluteus Maximus Response Palpable increased muscle length    Piriformis Response Palpable increased muscle length    Lumbar multifidi Response Palpable increased muscle length   prone                    PT Short Term Goals - 07/01/21 1419       PT SHORT TERM GOAL #1   Title The patient will demonstrate basic self care and ex's to relieve LE symptoms    Status Achieved      PT SHORT TERM GOAL #2   Title The patient will report a 30% reduction in right LE symptoms with walking medium distances    Status Achieved      PT SHORT TERM GOAL #3   Title The patient will have improved bil lumbo/pelvic/hip strength to grossly 4+/5 needed for standing to cook and ascend steps    Status Achieved               PT Long Term Goals - 07/01/21 1419       PT LONG TERM GOAL #1   Title The patient will be independent with safe self progression of  HEP    Time 12    Period Weeks    Status On-going    Target Date 09/02/21      PT LONG TERM GOAL #2   Title The patient will report a 70% improvement in right LE symptoms with walking    Time 12    Period Weeks    Status On-going      PT LONG TERM GOAL #3   Title The patient will be able to walk her dog medium distances    Time 12    Period Weeks    Status On-going      PT LONG TERM GOAL #4   Title The patient will have 4+/5 to 5-/5 lumbo pelvic hip strength needed for lifting medium weight objects    Time 12    Period Weeks    Status On-going      PT LONG TERM GOAL #5   Title FOTO score improved to 69% indicating improved function with less pain    Time 12    Period Weeks    Status On-going                   Plan - 07/01/21 1404     Clinical Impression Statement The patient reports a few good days following DN but the radicular pain from buttock to the top of her right foot returns limiting her ability to walk the dog.  Good short term response to joint mobilizations as well athough the pain always returns within a day or so.   She remains very active and performs her HEP faithfully despite this continued pain.  As we discussed last visit, she called to schedule an appt with the neurologist/neurosurgeon to discuss any additional options possibly including an ESI.  She has had 20 visits of PT and although she is somewhat better (per subjective report and FOTO outcome score), her continued peripheral symptoms persist.  She is highly fearful of "invasive interventions" but we discussed making her doctor/medical providers aware of these to help  manage her care in the best manner.    Personal Factors and Comorbidities Comorbidity 1;Time since onset of injury/illness/exacerbation    Comorbidities osteopenia    Examination-Activity Limitations Locomotion Level;Lift;Stand;Sleep    Examination-Participation Restrictions Meal Prep;Community Activity;Other    Rehab Potential  Good    PT Frequency Biweekly    PT Duration 12 weeks    PT Treatment/Interventions ADLs/Self Care Home Management;Aquatic Therapy;Cryotherapy;Electrical Stimulation;Ultrasound;Traction;Moist Heat;Iontophoresis 4mg /ml Dexamethasone;Neuromuscular re-education;Therapeutic exercise;Therapeutic activities;Patient/family education;Manual techniques;Dry needling;Taping;Spinal Manipulations    PT Next Visit Plan recheck FOTO next visit;  sees Dr. Saintclair Halsted next Thursday    PT Home Exercise Plan Access Code: [email or web address]             Patient will benefit from skilled therapeutic intervention in order to improve the following deficits and impairments:  Difficulty walking, Pain, Increased fascial restricitons, Impaired perceived functional ability, Decreased activity tolerance, Decreased strength  Visit Diagnosis: Radiculopathy, lumbar region  Sciatica, right side  Muscle weakness (generalized)     Problem List Patient Active Problem List   Diagnosis Date Noted   [place], RIGHT 03/20/2010   Ruben Im, PT 07/01/21 2:21 PM Phone: [phone] Fax: [phone]   Alvera Singh, PT 07/01/2021, 2:20 PM  [facility] @ [place], [place], [address] Phone: [phone]   Fax:  [phone]  Name: ROSE HEGNER MRN: [id number] Date of Birth: 1950-11-08

## 2021-07-08 ENCOUNTER — Encounter: Payer: PPO | Admitting: Physical Therapy

## 2021-07-09 DIAGNOSIS — F419 Anxiety disorder, unspecified: Secondary | ICD-10-CM | POA: Diagnosis not present

## 2021-07-09 DIAGNOSIS — Z79899 Other long term (current) drug therapy: Secondary | ICD-10-CM | POA: Diagnosis not present

## 2021-07-09 DIAGNOSIS — Z136 Encounter for screening for cardiovascular disorders: Secondary | ICD-10-CM | POA: Diagnosis not present

## 2021-07-09 DIAGNOSIS — Z0001 Encounter for general adult medical examination with abnormal findings: Secondary | ICD-10-CM | POA: Diagnosis not present

## 2021-07-09 DIAGNOSIS — Z1211 Encounter for screening for malignant neoplasm of colon: Secondary | ICD-10-CM | POA: Diagnosis not present

## 2021-07-09 DIAGNOSIS — E039 Hypothyroidism, unspecified: Secondary | ICD-10-CM | POA: Diagnosis not present

## 2021-07-09 DIAGNOSIS — G4700 Insomnia, unspecified: Secondary | ICD-10-CM | POA: Diagnosis not present

## 2021-07-10 ENCOUNTER — Other Ambulatory Visit (HOSPITAL_COMMUNITY): Payer: Self-pay

## 2021-07-10 DIAGNOSIS — M4316 Spondylolisthesis, lumbar region: Secondary | ICD-10-CM | POA: Diagnosis not present

## 2021-07-10 MED ORDER — DIAZEPAM 5 MG PO TABS
ORAL_TABLET | ORAL | 0 refills | Status: AC
  Filled 2021-07-10: qty 1, 1d supply, fill #0

## 2021-07-15 ENCOUNTER — Ambulatory Visit: Payer: PPO | Attending: Family Medicine | Admitting: Physical Therapy

## 2021-07-15 ENCOUNTER — Other Ambulatory Visit: Payer: Self-pay

## 2021-07-15 DIAGNOSIS — M5431 Sciatica, right side: Secondary | ICD-10-CM | POA: Insufficient documentation

## 2021-07-15 DIAGNOSIS — M6281 Muscle weakness (generalized): Secondary | ICD-10-CM | POA: Insufficient documentation

## 2021-07-15 DIAGNOSIS — M5416 Radiculopathy, lumbar region: Secondary | ICD-10-CM | POA: Insufficient documentation

## 2021-07-15 NOTE — Therapy (Signed)
[facility] ?[facility] ?[place], [place], [address] ?Phone: [phone]   Fax:  [phone] ? ?Physical Therapy Treatment ? ?Patient Details  ?Name: Renee Montgomery ?MRN: [id number] ?Date of Birth: 12/17/1950 ?Referring Provider (PT): Dr. Lauretta Grill [place] ? ? ?Encounter Date: 07/15/2021 ? ? PT End of Session - 07/15/21 1017   ? ? Visit Number 21   ? Date for PT Re-Evaluation 09/02/21   ? Authorization Type Medicare 20thvisit progress note   ? PT Start Time 1017   ? PT Stop Time 1059   ? PT Time Calculation (min) 42 min   ? Activity Tolerance Patient tolerated treatment well   ? ?  ?  ? ?  ? ? ?Past Medical History:  ?Diagnosis Date  ? Anxiety   ? Thyroid disease   ? Hypo  ? ? ?Past Surgical History:  ?Procedure Laterality Date  ? AUGMENTATION MAMMAPLASTY Bilateral   ? BREAST ENHANCEMENT SURGERY  2003  ? [place]  ? left  ? UMBILICAL HERNIA REPAIR  1990  ? ? ?There were no vitals filed for this visit. ? ? Subjective Assessment - 07/15/21 1017   ? ? Subjective As usual I'm better today.  Saw Dr. Saintclair Halsted and having an ESI at the end of the month.  It takes longer to recover from DN.  I feel like I need it though.  The hip is driving me crazy.  I don't know if I want it in the back.   ? Pertinent History osteopenia; saw Dr. Saintclair Halsted and will return in early Dec   ? How long can you stand comfortably? cooking bothers my back but not sciatica   ? 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 Patient Stated Goals I don't want to hurt; walk dog; get back to travel tours; stand in airports   ? Currently in Pain? Yes   ? Pain Score 4    ? Pain Location Hip   ? Pain Orientation Right   ? ?  ?  ? ?  ? ? ? ? ? ? ? ? ? ? ? ? ? ? ? ? ? ? ? ? [facility] Adult PT Treatment/Exercise - 07/15/21 0001   ? ?  ? Self-Care  ? Other Self-Care Comments  discussion of symptoms and effect of exercise, DN and manual therapy response;  strategizing for treatment before and after ESI   ?  ? Manual Therapy  ? Manual therapy comments right piriformis contract/relax 3x 5 sec   ? Joint Mobilization 3x 30 sec each; neutral gapping grade 3 3x; long axis distraction, inferior mob   ? Soft tissue mobilization right glutes and right lumbar paraspinals   ? Manual Traction 30 sec oscillations 3x   ? ?  ?  ? ?  ? ? ? Trigger Point Dry Needling - 07/15/21 0001   ? ? Consent Given? Yes   ? Dry Needling Comments right   ? Other Dry Needling   ? Gluteus Minimus Response Twitch response elicited;Palpable increased muscle length   ? Gluteus Medius Response Twitch response elicited;Palpable increased muscle length   ? Gluteus Maximus Response Palpable increased muscle length   ? Piriformis Response Palpable increased muscle length   ? Lumbar multifidi Response -- hold  ? ?  ?  ? ?  ? ? ? ? ? ? ? ? ? ? PT Short Term Goals - 07/01/21  1419   ? ?  ? PT SHORT TERM GOAL #1  ? Title The patient will demonstrate basic self care and ex's to relieve LE symptoms   ? Status Achieved   ?  ? PT SHORT TERM GOAL #2  ? Title The patient will report a 30% reduction in right LE symptoms with walking medium distances   ? Status Achieved   ?  ? PT SHORT TERM GOAL #3  ? Title The patient will have improved bil lumbo/pelvic/hip strength to grossly 4+/5 needed for standing to cook and ascend steps   ? Status Achieved   ? ?  ?  ? ?  ? ? ? ? PT Long Term Goals - 07/01/21 1419   ? ?  ? PT LONG TERM GOAL #1  ? Title The patient will be independent with safe self progression of HEP   ? Time 12   ? Period Weeks   ? Status On-going   ? Target Date 09/02/21   ?  ? PT LONG TERM GOAL #2  ? Title The patient will report a 70% improvement in right LE symptoms with walking   ? Time 12   ? Period Weeks   ? Status On-going   ?  ? PT LONG TERM GOAL #3  ? Title The patient will be able to walk her dog medium distances   ? Time 12   ? Period Weeks   ? Status On-going   ?  ? PT LONG TERM GOAL #4  ? Title The  patient will have 4+/5 to 5-/5 lumbo pelvic hip strength needed for lifting medium weight objects   ? Time 12   ? Period Weeks   ? Status On-going   ?  ? PT LONG TERM GOAL #5  ? Title FOTO score improved to 69% indicating improved function with less pain   ? Time 12   ? Period Weeks   ? Status On-going   ? ?  ?  ? ?  ? ? ? ? ? ? ? ? Plan - 07/15/21 1505   ? ? Clinical Impression Statement Hold on DN to lumbar musculature at her request secondary to difficulty recuperating from DN sessions.  She has a positive response to DN to posterior hip musculature with soft tissue mobilization as well as joint mobilization of the right hip and lumbar spine.  Following treatment session she is moving well and reports she "feels good."  Therapist monitoring response and modifying treatment accordingly.   ? Examination-Participation Restrictions Meal Prep;Community Activity;Other   ? Rehab Potential Good   ? PT Frequency Biweekly   ? PT Duration 12 weeks   ? PT Treatment/Interventions ADLs/Self Care Home Management;Aquatic Therapy;Cryotherapy;Electrical Stimulation;Ultrasound;Traction;Moist Heat;Iontophoresis '4mg'$ /ml Dexamethasone;Neuromuscular re-education;Therapeutic exercise;Therapeutic activities;Patient/family education;Manual techniques;Dry needling;Taping;Spinal Manipulations   ? PT Next Visit Plan recheck [place] next visit;  ESI end of March;  DN right hip; joint mobs lumbar spine and hip   ? PT Home Exercise Plan Access Code: [email or web address]   ? ?  ?  ? ?  ? ? ?Patient will benefit from skilled therapeutic intervention in order to improve the following deficits and impairments:  Difficulty walking, Pain, Increased fascial restricitons, Impaired perceived functional ability, Decreased activity tolerance, Decreased strength ? ?Visit Diagnosis: ?Radiculopathy, lumbar region ? ?Sciatica, right side ? ?Muscle weakness (generalized) ? ? ? ? ?Problem List ?Patient Active Problem List  ? Diagnosis Date Noted  ? SHOULDER STRAIN, RIGHT  03/20/2010  ? ?Ruben Im, PT ?07/15/21 3:09  PM ?Phone: [phone] ?Fax: [phone]  ?Alvera Singh, PT ?07/15/2021, 3:08 PM ? ?[facility] ?[facility] [place] ?[place], [place], [address] ?Phone: [phone]   Fax:  [phone] ? ?Name: Renee Montgomery ?MRN: [id number] ?Date of Birth: [birth year] ? ? ? ?

## 2021-07-30 DIAGNOSIS — M9905 Segmental and somatic dysfunction of pelvic region: Secondary | ICD-10-CM | POA: Diagnosis not present

## 2021-07-30 DIAGNOSIS — M5136 Other intervertebral disc degeneration, lumbar region: Secondary | ICD-10-CM | POA: Diagnosis not present

## 2021-07-30 DIAGNOSIS — M9904 Segmental and somatic dysfunction of sacral region: Secondary | ICD-10-CM | POA: Diagnosis not present

## 2021-07-30 DIAGNOSIS — M9903 Segmental and somatic dysfunction of lumbar region: Secondary | ICD-10-CM | POA: Diagnosis not present

## 2021-07-31 DIAGNOSIS — M5136 Other intervertebral disc degeneration, lumbar region: Secondary | ICD-10-CM | POA: Diagnosis not present

## 2021-07-31 DIAGNOSIS — M9903 Segmental and somatic dysfunction of lumbar region: Secondary | ICD-10-CM | POA: Diagnosis not present

## 2021-07-31 DIAGNOSIS — M9905 Segmental and somatic dysfunction of pelvic region: Secondary | ICD-10-CM | POA: Diagnosis not present

## 2021-07-31 DIAGNOSIS — M9904 Segmental and somatic dysfunction of sacral region: Secondary | ICD-10-CM | POA: Diagnosis not present

## 2021-08-04 DIAGNOSIS — M5416 Radiculopathy, lumbar region: Secondary | ICD-10-CM | POA: Diagnosis not present

## 2021-08-12 ENCOUNTER — Ambulatory Visit: Payer: PPO | Attending: Family Medicine | Admitting: Physical Therapy

## 2021-08-12 DIAGNOSIS — M5431 Sciatica, right side: Secondary | ICD-10-CM | POA: Insufficient documentation

## 2021-08-12 DIAGNOSIS — M5416 Radiculopathy, lumbar region: Secondary | ICD-10-CM | POA: Insufficient documentation

## 2021-08-12 DIAGNOSIS — M6281 Muscle weakness (generalized): Secondary | ICD-10-CM | POA: Insufficient documentation

## 2021-08-12 NOTE — Patient Instructions (Signed)
Access Code: [email or web address] ?URL: [email or web address] ?Date: 08/12/2021 ?Prepared by: Ruben Im ? ?Exercises ?- Supine Hip Internal and External Rotation  - 1 x daily - 7 x weekly - 1 sets - 20 reps ?- Standing Hip Flexor Stretch  - 1 x daily - 7 x weekly - 1 sets - 2 reps - 20 hold ?- Quadruped Rocking Backward  - 1 x daily - 7 x weekly - 1 sets - 10 reps ?- Supine Bent Leg Lift with Knee Extension  - 1 x daily - 7 x weekly - 1 sets - 10 reps ?- Clamshell with Resistance  - 1 x daily - 7 x weekly - 1 sets - 10 reps ?- Hooklying Clamshell with Resistance  - 1 x daily - 7 x weekly - 1 sets - 10 reps ?- Prone Hip Extension - One Pillow  - 1 x daily - 7 x weekly - 1 sets - 5 reps ?- Prone Scapular Retraction Arms at Side  - 1 x daily - 7 x weekly - 1 sets - 5 reps ?- Supine Peroneal Nerve Glide  - 1 x daily - 7 x weekly - 1 sets - 5 reps ?- Supine Sciatic Nerve Glide  - 1 x daily - 7 x weekly - 1 sets - 5 reps ?- doorway hip flexor stretch   - 1 x daily - 7 x weekly - 1 sets - 10 reps ?- Standing Row with Anchored Resistance Band with PLB  - 1 x daily - 7 x weekly - 1 sets - 10 reps ?- Standing High Row with Anchored Resistance  - 1 x daily - 7 x weekly - 1 sets - 10 reps ?- Shoulder Extension with Resistance Hands Down  - 1 x daily - 7 x weekly - 1 sets - 10 reps ?- Seated Slump Nerve Glide  - 1 x daily - 7 x weekly - 1 sets - 10 reps ?- Self Traction in Standing with Counter Top  - 1 x daily - 7 x weekly - 1 sets - 3-5 reps - 20 hold ?- SNATCH AND PRESS OVERHEAD  - 1 x daily - 7 x weekly - 2 sets - 10 reps ?- Primal Push Up  - 1 x daily - 7 x weekly - 1 sets - 5 reps - 3-5 hold ?- Side Plank on Knees  - 1 x daily - 7 x weekly - 1 sets - 5 reps - 3-5 hold ?- Half Kneeling on Foam Pad  - 1 x daily - 7 x weekly - 1 sets - 1 reps - 60 hold ?- Beginner Front Arm Support  - 1 x daily - 7 x weekly - 1 sets - 10 reps ?- Hooklying Isometric Hip Flexion  - 1 x daily - 7 x weekly - 1 sets - 10 reps ?- Half  Kneeling on Foam Pad  - 1 x daily - 7 x weekly - 3 sets - 10 reps ?

## 2021-08-12 NOTE — Therapy (Signed)
[facility] ?[facility] ?[place], [place], [address] ?Phone: [phone]   Fax:  [phone] ? ?Physical Therapy Treatment ? ?Patient Details  ?Name: Renee Montgomery ?MRN: [id number] ?Date of Birth: 1950/09/19 ?Referring Provider (PT): Dr. Lauretta Grill [place] ? ? ?Encounter Date: 08/12/2021 ? ? PT End of Session - 08/12/21 1019   ? ? Visit Number 22   ? Date for PT Re-Evaluation 09/02/21   ? Authorization Type Medicare 20thvisit progress note   ? PT Start Time 1018   ? PT Stop Time 1057   ? PT Time Calculation (min) 39 min   ? Activity Tolerance Patient tolerated treatment well   ? ?  ?  ? ?  ? ? ?Past Medical History:  ?Diagnosis Date  ? Anxiety   ? Thyroid disease   ? Hypo  ? ? ?Past Surgical History:  ?Procedure Laterality Date  ? AUGMENTATION MAMMAPLASTY Bilateral   ? BREAST ENHANCEMENT SURGERY  2003  ? [place]  ? left  ? UMBILICAL HERNIA REPAIR  1990  ? ? ?There were no vitals filed for this visit. ? ? Subjective Assessment - 08/12/21 1019   ? ? Subjective Had ESI on 3/27.  Better but still some pain.  I haven't walked the dog.  Did a lot of stretching on Friday.  Worked yesterday at [employer] from 10-3 and I hurt at the end of the day.   ? Pertinent History osteopenia; saw Dr. Saintclair Halsted and will return in early Dec   ? Limitations Walking;House hold activities   ? How long can you stand comfortably? cooking bothers my back but not sciatica   ? 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 Patient Stated Goals I don't want to hurt; walk dog; get back to travel tours; stand in airports   ? Currently in Pain? Yes   ? Pain Score 3    soreness  ? Pain Location Back   right buttock  ? Pain Orientation Right   ? ?  ?  ? ?  ? ? ? ? ? ? ? ? ? ? ? ? ? ? ? ? ? ? ? ? [facility] Adult PT Treatment/Exercise - 08/12/21 0001   ? ?  ? Lumbar Exercises: Supine  ? Isometric Hip Flexion 10 reps   ? Isometric Hip  Flexion Limitations hand to knee push   ?  ? Lumbar Exercises: Quadruped  ? Straight Leg Raise 10 reps   ? Straight Leg Raises Limitations to neutral   ?  ? Knee/Hip Exercises: Stretches  ? Other Knee/Hip Stretches supine floss 8x right/left   ? Other Knee/Hip Stretches 1/2 kneel stretch forward   ?  ? Manual Therapy  ? Joint Mobilization 3x 30 sec each; neutral gapping grade 3 3x; long axis distraction, inferior mob   ? Soft tissue mobilization right glutes and right lumbar paraspinals   ? Manual Traction 30 sec oscillations 3x   ? ?  ?  ? ?  ? ? ? ? ? ? ? ? ? ? PT Education - 08/12/21 1644   ? ? Education Details see HEP   ? Person(s) Educated Patient   ? Methods Explanation;Demonstration;Handout   ? Comprehension Returned demonstration;Verbalized understanding   ? ?  ?  ? ?  ? ? ? PT Short Term Goals - 07/01/21 1419   ? ?  ? PT SHORT TERM GOAL #1  ?  Title The patient will demonstrate basic self care and ex's to relieve LE symptoms   ? Status Achieved   ?  ? PT SHORT TERM GOAL #2  ? Title The patient will report a 30% reduction in right LE symptoms with walking medium distances   ? Status Achieved   ?  ? PT SHORT TERM GOAL #3  ? Title The patient will have improved bil lumbo/pelvic/hip strength to grossly 4+/5 needed for standing to cook and ascend steps   ? Status Achieved   ? ?  ?  ? ?  ? ? ? ? PT Long Term Goals - 07/01/21 1419   ? ?  ? PT LONG TERM GOAL #1  ? Title The patient will be independent with safe self progression of HEP   ? Time 12   ? Period Weeks   ? Status On-going   ? Target Date 09/02/21   ?  ? PT LONG TERM GOAL #2  ? Title The patient will report a 70% improvement in right LE symptoms with walking   ? Time 12   ? Period Weeks   ? Status On-going   ?  ? PT LONG TERM GOAL #3  ? Title The patient will be able to walk her dog medium distances   ? Time 12   ? Period Weeks   ? Status On-going   ?  ? PT LONG TERM GOAL #4  ? Title The patient will have 4+/5 to 5-/5 lumbo pelvic hip strength needed for  lifting medium weight objects   ? Time 12   ? Period Weeks   ? Status On-going   ?  ? PT LONG TERM GOAL #5  ? Title FOTO score improved to 69% indicating improved function with less pain   ? Time 12   ? Period Weeks   ? Status On-going   ? ?  ?  ? ?  ? ? ? ? ? ? ? ? Plan - 08/12/21 1644   ? ? Clinical Impression Statement Discussed graded return to activity and exercise.  Emphasized neutral and flexion biased ex and avoid increased lumbar extension which seems to aggravate symptoms.  Encouraged neural flossing rather than full neural tension.  Discussed short duration walking return.  Verbal and tactile cues for ex technique and close monitoring of response.   ? Examination-Participation Restrictions Meal Prep;Community Activity;Other   ? Rehab Potential Good   ? PT Frequency Biweekly   ? PT Duration 12 weeks   ? PT Treatment/Interventions ADLs/Self Care Home Management;Aquatic Therapy;Cryotherapy;Electrical Stimulation;Ultrasound;Traction;Moist Heat;Iontophoresis '4mg'$ /ml Dexamethasone;Neuromuscular re-education;Therapeutic exercise;Therapeutic activities;Patient/family education;Manual techniques;Dry needling;Taping;Spinal Manipulations   ? PT Next Visit Plan recheck [place] next visit;  joint mobs lumbar spine and hip;  low intensity ex neutral or flexion bias;  hold DN   ? PT Home Exercise Plan Access Code: [email or web address]   ? ?  ?  ? ?  ? ? ?Patient will benefit from skilled therapeutic intervention in order to improve the following deficits and impairments:  Difficulty walking, Pain, Increased fascial restricitons, Impaired perceived functional ability, Decreased activity tolerance, Decreased strength ? ?Visit Diagnosis: ?Radiculopathy, lumbar region ? ?Sciatica, right side ? ?Muscle weakness (generalized) ? ? ? ? ?Problem List ?Patient Active Problem List  ? Diagnosis Date Noted  ? SHOULDER STRAIN, RIGHT 03/20/2010  ? ?Ruben Im, PT ?08/12/21 4:49 PM ?Phone: [phone] ?Fax: [phone]  ?[facility] ?[facility] @ [place] ?[place], [place], [address] ?Phone: [phone]  Fax:  [phone] ? ?Name: Renee Montgomery ?MRN: [id number] ?Date of Birth: July 12, 1950 ? ? ? ?

## 2021-08-14 DIAGNOSIS — M9905 Segmental and somatic dysfunction of pelvic region: Secondary | ICD-10-CM | POA: Diagnosis not present

## 2021-08-14 DIAGNOSIS — M9903 Segmental and somatic dysfunction of lumbar region: Secondary | ICD-10-CM | POA: Diagnosis not present

## 2021-08-14 DIAGNOSIS — M5136 Other intervertebral disc degeneration, lumbar region: Secondary | ICD-10-CM | POA: Diagnosis not present

## 2021-08-14 DIAGNOSIS — M9904 Segmental and somatic dysfunction of sacral region: Secondary | ICD-10-CM | POA: Diagnosis not present

## 2021-08-18 DIAGNOSIS — M5136 Other intervertebral disc degeneration, lumbar region: Secondary | ICD-10-CM | POA: Diagnosis not present

## 2021-08-18 DIAGNOSIS — M9904 Segmental and somatic dysfunction of sacral region: Secondary | ICD-10-CM | POA: Diagnosis not present

## 2021-08-18 DIAGNOSIS — M9903 Segmental and somatic dysfunction of lumbar region: Secondary | ICD-10-CM | POA: Diagnosis not present

## 2021-08-18 DIAGNOSIS — M9905 Segmental and somatic dysfunction of pelvic region: Secondary | ICD-10-CM | POA: Diagnosis not present

## 2021-09-02 ENCOUNTER — Ambulatory Visit: Payer: PPO | Admitting: Physical Therapy

## 2021-09-02 DIAGNOSIS — M5416 Radiculopathy, lumbar region: Secondary | ICD-10-CM

## 2021-09-02 DIAGNOSIS — M5431 Sciatica, right side: Secondary | ICD-10-CM

## 2021-09-02 DIAGNOSIS — M6281 Muscle weakness (generalized): Secondary | ICD-10-CM

## 2021-09-02 NOTE — Patient Instructions (Signed)
Access Code: [email or web address] ?URL: [email or web address] ?Date: 09/02/2021 ?Prepared by: Ruben Im ? ?Exercises ?- Supine Hip Internal and External Rotation  - 1 x daily - 7 x weekly - 1 sets - 20 reps ?- Standing Hip Flexor Stretch  - 1 x daily - 7 x weekly - 1 sets - 2 reps - 20 hold ?- Quadruped Rocking Backward  - 1 x daily - 7 x weekly - 1 sets - 10 reps ?- Supine Bent Leg Lift with Knee Extension  - 1 x daily - 7 x weekly - 1 sets - 10 reps ?- Clamshell with Resistance  - 1 x daily - 7 x weekly - 1 sets - 10 reps ?- Hooklying Clamshell with Resistance  - 1 x daily - 7 x weekly - 1 sets - 10 reps ?- Prone Hip Extension - One Pillow  - 1 x daily - 7 x weekly - 1 sets - 5 reps ?- Prone Scapular Retraction Arms at Side  - 1 x daily - 7 x weekly - 1 sets - 5 reps ?- Supine Peroneal Nerve Glide  - 1 x daily - 7 x weekly - 1 sets - 5 reps ?- Supine Sciatic Nerve Glide  - 1 x daily - 7 x weekly - 1 sets - 5 reps ?- doorway hip flexor stretch   - 1 x daily - 7 x weekly - 1 sets - 10 reps ?- Standing Row with Anchored Resistance Band with PLB  - 1 x daily - 7 x weekly - 1 sets - 10 reps ?- Standing High Row with Anchored Resistance  - 1 x daily - 7 x weekly - 1 sets - 10 reps ?- Shoulder Extension with Resistance Hands Down  - 1 x daily - 7 x weekly - 1 sets - 10 reps ?- Seated Slump Nerve Glide  - 1 x daily - 7 x weekly - 1 sets - 10 reps ?- Self Traction in Standing with Counter Top  - 1 x daily - 7 x weekly - 1 sets - 3-5 reps - 20 hold ?- SNATCH AND PRESS OVERHEAD  - 1 x daily - 7 x weekly - 2 sets - 10 reps ?- Primal Push Up  - 1 x daily - 7 x weekly - 1 sets - 5 reps - 3-5 hold ?- Side Plank on Knees  - 1 x daily - 7 x weekly - 1 sets - 5 reps - 3-5 hold ?- Half Kneeling on Foam Pad  - 1 x daily - 7 x weekly - 1 sets - 1 reps - 60 hold ?- Beginner Front Arm Support  - 1 x daily - 7 x weekly - 1 sets - 10 reps ?- Hooklying Isometric Hip Flexion  - 1 x daily - 7 x weekly - 1 sets - 10 reps ?- Half  Kneeling on Foam Pad  - 1 x daily - 7 x weekly - 3 sets - 10 reps ?- Plank on Knees  - 1 x daily - 7 x weekly - 1 sets - 10 reps - 5-10 hold ?- WALL SLIDES  - 1 x daily - 7 x weekly - 1 sets - 10 reps ?- Squat with Chair Touch  - 1 x daily - 7 x weekly - 1 sets - 10 reps ?- Isometric Gluteus Medius at Wall  - 1 x daily - 7 x weekly - 1 sets - 5 reps - 5 hold ?

## 2021-09-02 NOTE — Therapy (Signed)
[facility] ?[place], [place], [address] ?Phone: [phone]   Fax:  [phone] ? ?Physical Therapy Treatment/Recertification ? ?Patient Details  ?Name: Renee Montgomery ?MRN: [id number] ?Date of Birth: 21-Mar-1951 ?Referring Provider (PT): Dr. Lauretta Grill [place] ? ? ?Encounter Date: 09/02/2021 ? ? PT End of Session - 09/02/21 1358   ? ? Visit Number 23   ? Date for PT Re-Evaluation 10/28/21   ? Authorization Type Medicare 30thvisit progress note   ? PT Start Time 1400   ? PT Stop Time 1440   ? PT Time Calculation (min) 40 min   ? Activity Tolerance Patient tolerated treatment well   ? ?  ?  ? ?  ? ? ?Past Medical History:  ?Diagnosis Date  ? Anxiety   ? Thyroid disease   ? Hypo  ? ? ?Past Surgical History:  ?Procedure Laterality Date  ? AUGMENTATION MAMMAPLASTY Bilateral   ? BREAST ENHANCEMENT SURGERY  2003  ? [place]  ? left  ? UMBILICAL HERNIA REPAIR  1990  ? ? ?There were no vitals filed for this visit. ? ? Subjective Assessment - 09/02/21 1359   ? ? Subjective I found a trick that has really helped.  I got a SI belt and that's like night and day.  It makes me not have pain but the end of the day I feel it.  Overall less spasms.  Back gets tired.  I can walk the dog to the park now.  Sees the neurosurgeon on Thursday.   ? Pertinent History osteopenia; saw Dr. Saintclair Halsted and will return in early Dec   ? How long can you stand comfortably? cooking bothers my back but not sciatica   ? 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 Patient Stated Goals I don't want to hurt; walk dog; get back to travel tours; stand in airports   ? Currently in Pain? Yes   ? Pain Location Back   ? ?  ?  ? ?  ? ? ? ? ? [facility] PT Assessment - 09/02/21 0001   ? ?  ? Observation/Other Assessments  ? Focus on Therapeutic Outcomes (FOTO)  65%   ?  ? Strength  ? Overall Strength Comments pelvic drop in quadruped   ?  Right Hip Extension 4+/5   ? Right Hip ABduction 4/5   ? Left Hip Extension 4+/5   ? Left Hip ABduction 4+/5   ? Lumbar Flexion 4/5   ? Lumbar Extension 4/5   ? ?  ?  ? ?  ? ? ? ? ? ? ? ? ? ? ? ? ? ? ? ? [facility] Adult PT Treatment/Exercise - 09/02/21 0001   ? ?  ? Lumbar Exercises: Standing  ? Wall Slides 10 reps   ? Other Standing Lumbar Exercises Partial squats chair touch 10x   ? Other Standing Lumbar Exercises ball on wall hip abduction isometrics 5x 5 sec hold right/left   ?  ? Lumbar Exercises: Supine  ? Isometric Hip Flexion 10 reps   ? Isometric Hip Flexion Limitations hand to knee push   ? Other Supine Lumbar Exercises bent knee raises 10x with   ?  ? Lumbar Exercises: Prone  ? Other Prone Lumbar Exercises modified plank 10 sec 10x   ?  ? Lumbar Exercises: Quadruped  ? Opposite Arm/Leg Raise Right arm/Left leg;Left arm/Right leg;5 reps   ? ?  ?  ? ?  ? ? ? ? ? ? ? ? ? ? ? ?  PT Short Term Goals - 07/01/21 1419   ? ?  ? PT SHORT TERM GOAL #1  ? Title The patient will demonstrate basic self care and ex's to relieve LE symptoms   ? Status Achieved   ?  ? PT SHORT TERM GOAL #2  ? Title The patient will report a 30% reduction in right LE symptoms with walking medium distances   ? Status Achieved   ?  ? PT SHORT TERM GOAL #3  ? Title The patient will have improved bil lumbo/pelvic/hip strength to grossly 4+/5 needed for standing to cook and ascend steps   ? Status Achieved   ? ?  ?  ? ?  ? ? ? ? PT Long Term Goals - 09/02/21 1724   ? ?  ? PT LONG TERM GOAL #1  ? Title The patient will be independent with safe self progression of HEP   ? Time 12   ? Period Weeks   ? Status On-going   ? Target Date 11/25/21   ?  ? PT LONG TERM GOAL #2  ? Title The patient will report a 70% improvement in right LE symptoms with walking   ? Time 12   ? Period Weeks   ? Status On-going   ?  ? PT LONG TERM GOAL #3  ? Title The patient will be able to walk her dog medium distances   ? Status Achieved   ?  ? PT LONG TERM GOAL #4  ? Title The  patient will have 4+/5 to 5-/5 lumbo pelvic hip strength needed for lifting medium weight objects   ? Time 12   ? Period Weeks   ? Status On-going   ?  ? PT LONG TERM GOAL #5  ? Title FOTO score improved to 69% indicating improved function with less pain   ? Time 12   ? Period Weeks   ? Status On-going   ? ?  ?  ? ?  ? ? ? ? ? ? ? ? Plan - 09/02/21 1719   ? ? Clinical Impression Statement The patient reports significantly improved function and pain since she began wearing an SI support.  Able to perform lying down ex's without support but added with standing ex's.  Right glute weakness noted in quadruped with weightbearing on right side and single leg standing.  Quick muscular fatigue with wall isometric ex's.  Pain level low throughout session.  She would benefit from biweekly follow up for core strengthening and right hip strengthening to return to walking longer distances and return to line dancing.   ? Personal Factors and Comorbidities Comorbidity 1;Time since onset of injury/illness/exacerbation   ? Examination-Activity Limitations Locomotion Level;Lift;Stand;Sleep   ? Examination-Participation Restrictions Meal Prep;Community Activity;Other   ? Rehab Potential Good   ? PT Frequency Biweekly   ? PT Duration 12 weeks   ? PT Treatment/Interventions ADLs/Self Care Home Management;Aquatic Therapy;Cryotherapy;Electrical Stimulation;Ultrasound;Traction;Moist Heat;Iontophoresis '4mg'$ /ml Dexamethasone;Neuromuscular re-education;Therapeutic exercise;Therapeutic activities;Patient/family education;Manual techniques;Dry needling;Taping;Spinal Manipulations   ? PT Next Visit Plan follow up in 2-3 weeks with core progression and right glute med single leg progression as tolerated; check FOTO   ? PT Home Exercise Plan Access Code: [email or web address]   ? ?  ?  ? ?  ? ? ?Patient will benefit from skilled therapeutic intervention in order to improve the following deficits and impairments:  Difficulty walking, Pain, Increased fascial  restricitons, Impaired perceived functional ability, Decreased activity tolerance, Decreased strength ? ?Visit Diagnosis: ?Radiculopathy, lumbar region -  Plan: PT plan of care cert/re-cert ? ?Sciatica, right side - Plan: PT plan of care cert/re-cert ? ?Muscle weakness (generalized) - Plan: PT plan of care cert/re-cert ? ? ? ? ?Problem List ?Patient Active Problem List  ? Diagnosis Date Noted  ? SHOULDER STRAIN, RIGHT 03/20/2010  ? ? ?Alvera Singh, PT ?09/02/2021, 5:27 PM ? ?[facility] ?[place], [place], [address] ?Phone: [phone]   Fax:  [phone] ? ?Name: LEILENE DIPRIMA ?MRN: [id number] ?Date of Birth: [birth year] ? ? ? ?

## 2021-09-04 DIAGNOSIS — M4316 Spondylolisthesis, lumbar region: Secondary | ICD-10-CM | POA: Diagnosis not present

## 2021-09-12 ENCOUNTER — Other Ambulatory Visit (HOSPITAL_COMMUNITY): Payer: Self-pay

## 2021-09-12 MED ORDER — LEVOTHYROXINE SODIUM 100 MCG PO TABS
100.0000 ug | ORAL_TABLET | Freq: Every morning | ORAL | 3 refills | Status: AC
  Filled 2021-09-12: qty 90, 90d supply, fill #0
  Filled 2021-12-09: qty 90, 90d supply, fill #1
  Filled 2022-03-17: qty 90, 90d supply, fill #2

## 2021-09-16 ENCOUNTER — Other Ambulatory Visit (HOSPITAL_COMMUNITY): Payer: Self-pay

## 2021-09-16 ENCOUNTER — Ambulatory Visit: Payer: PPO | Attending: Family Medicine | Admitting: Physical Therapy

## 2021-09-16 DIAGNOSIS — M6281 Muscle weakness (generalized): Secondary | ICD-10-CM | POA: Diagnosis not present

## 2021-09-16 DIAGNOSIS — M5416 Radiculopathy, lumbar region: Secondary | ICD-10-CM | POA: Diagnosis not present

## 2021-09-16 DIAGNOSIS — M5431 Sciatica, right side: Secondary | ICD-10-CM | POA: Diagnosis not present

## 2021-09-16 NOTE — Patient Instructions (Signed)
Access Code: [email or web address] ?URL: [email or web address] ?Date: 09/16/2021 ?Prepared by: Ruben Im ? ?Exercises ?- Supine Hip Internal and External Rotation  - 1 x daily - 7 x weekly - 1 sets - 20 reps ?- Standing Hip Flexor Stretch  - 1 x daily - 7 x weekly - 1 sets - 2 reps - 20 hold ?- Quadruped Rocking Backward  - 1 x daily - 7 x weekly - 1 sets - 10 reps ?- Supine Bent Leg Lift with Knee Extension  - 1 x daily - 7 x weekly - 1 sets - 10 reps ?- Clamshell with Resistance  - 1 x daily - 7 x weekly - 1 sets - 10 reps ?- Hooklying Clamshell with Resistance  - 1 x daily - 7 x weekly - 1 sets - 10 reps ?- Prone Hip Extension - One Pillow  - 1 x daily - 7 x weekly - 1 sets - 5 reps ?- Prone Scapular Retraction Arms at Side  - 1 x daily - 7 x weekly - 1 sets - 5 reps ?- Supine Peroneal Nerve Glide  - 1 x daily - 7 x weekly - 1 sets - 5 reps ?- Supine Sciatic Nerve Glide  - 1 x daily - 7 x weekly - 1 sets - 5 reps ?- doorway hip flexor stretch   - 1 x daily - 7 x weekly - 1 sets - 10 reps ?- Standing Row with Anchored Resistance Band with PLB  - 1 x daily - 7 x weekly - 1 sets - 10 reps ?- Standing High Row with Anchored Resistance  - 1 x daily - 7 x weekly - 1 sets - 10 reps ?- Shoulder Extension with Resistance Hands Down  - 1 x daily - 7 x weekly - 1 sets - 10 reps ?- Seated Slump Nerve Glide  - 1 x daily - 7 x weekly - 1 sets - 10 reps ?- Self Traction in Standing with Counter Top  - 1 x daily - 7 x weekly - 1 sets - 3-5 reps - 20 hold ?- SNATCH AND PRESS OVERHEAD  - 1 x daily - 7 x weekly - 2 sets - 10 reps ?- Primal Push Up  - 1 x daily - 7 x weekly - 1 sets - 5 reps - 3-5 hold ?- Side Plank on Knees  - 1 x daily - 7 x weekly - 1 sets - 5 reps - 3-5 hold ?- Half Kneeling on Foam Pad  - 1 x daily - 7 x weekly - 1 sets - 1 reps - 60 hold ?- Beginner Front Arm Support  - 1 x daily - 7 x weekly - 1 sets - 10 reps ?- Hooklying Isometric Hip Flexion  - 1 x daily - 7 x weekly - 1 sets - 10 reps ?- Half  Kneeling on Foam Pad  - 1 x daily - 7 x weekly - 3 sets - 10 reps ?- Plank on Knees  - 1 x daily - 7 x weekly - 1 sets - 10 reps - 5-10 hold ?- WALL SLIDES  - 1 x daily - 7 x weekly - 1 sets - 10 reps ?- Squat with Chair Touch  - 1 x daily - 7 x weekly - 1 sets - 10 reps ?- Isometric Gluteus Medius at Wall  - 1 x daily - 7 x weekly - 1 sets - 5 reps - 5 hold ?- Dead Bug  - 1 x  daily - 7 x weekly - 1 sets - 10 reps ?

## 2021-09-16 NOTE — Therapy (Signed)
?[facility] ?[place], [place], [address] ?Phone: [phone]   Fax:  [phone] ? ?Physical Therapy Treatment ? ?Patient Details  ?Name: Renee Montgomery ?MRN: [id number] ?Date of Birth: Mar 17, 1951 ?Referring Provider (PT): Dr. Lauretta Grill [place] ? ? ?Encounter Date: 09/16/2021 ? ? PT End of Session - 09/16/21 1149   ? ? Visit Number 24   ? Date for PT Re-Evaluation 10/28/21   ? Authorization Type Medicare 30thvisit progress note   ? PT Start Time 1147   ? PT Stop Time 1228   ? PT Time Calculation (min) 41 min   ? Activity Tolerance Patient tolerated treatment well   ? ?  ?  ? ?  ? ? ?Past Medical History:  ?Diagnosis Date  ? Anxiety   ? Thyroid disease   ? Hypo  ? ? ?Past Surgical History:  ?Procedure Laterality Date  ? AUGMENTATION MAMMAPLASTY Bilateral   ? BREAST ENHANCEMENT SURGERY  2003  ? [place]  ? left  ? UMBILICAL HERNIA REPAIR  1990  ? ? ?There were no vitals filed for this visit. ? ? Subjective Assessment - 09/16/21 1151   ? ? Subjective I've been active working in my garden.  Hurt at the end of the day sharper and buttock pain  but I'm good in the mornings.  Getting another ESI in 1 1/2 weeks.   ? Limitations Walking;House hold activities   ? 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 Patient Stated Goals I don't want to hurt; walk dog; get back to travel tours; stand in airports   ? Currently in Pain? Yes   ? Pain Score 2    ? Pain Location Back   ? Pain Type Chronic pain   ? ?  ?  ? ?  ? ? ? ? ? ? ? ? ? ? ? ? ? ? ? ? ? ? ? ? [facility] Adult PT Treatment/Exercise - 09/16/21 0001   ? ?  ? Lumbar Exercises: Supine  ? Isometric Hip Flexion 10 reps   ? Isometric Hip Flexion Limitations hand to knee push   ? Other Supine Lumbar Exercises bent knee raises 10x with   ? Other Supine Lumbar Exercises Pilates ball progress with dead bug 2x 10   ?  ? Lumbar Exercises: Prone  ? Other  Prone Lumbar Exercises modified plank 10 sec 10x   ?  ? Manual Therapy  ? Joint Mobilization 3x 30 sec each; neutral gapping grade 3 3x; long axis distraction, inferior mob; neutral gapping grade 4 30 sec   ? Manual Traction 30 sec oscillations 3x right   ? ?  ?  ? ?  ? ? ? ? ? ? ? ? ? ? PT Education - 09/16/21 1226   ? ? Education Details DEAD BUG   ? Person(s) Educated Patient   ? Methods Explanation;Demonstration;Handout   ? Comprehension Returned demonstration;Verbalized understanding   ? ?  ?  ? ?  ? ? ? PT Short Term Goals - 07/01/21 1419   ? ?  ? PT SHORT TERM GOAL #1  ? Title The patient will demonstrate basic self care and ex's to relieve LE symptoms   ? Status Achieved   ?  ? PT SHORT TERM GOAL #2  ? Title The patient will report a 30% reduction in right LE symptoms with walking medium distances   ?  Status Achieved   ?  ? PT SHORT TERM GOAL #3  ? Title The patient will have improved bil lumbo/pelvic/hip strength to grossly 4+/5 needed for standing to cook and ascend steps   ? Status Achieved   ? ?  ?  ? ?  ? ? ? ? PT Long Term Goals - 09/02/21 1724   ? ?  ? PT LONG TERM GOAL #1  ? Title The patient will be independent with safe self progression of HEP   ? Time 12   ? Period Weeks   ? Status On-going   ? Target Date 11/25/21   ?  ? PT LONG TERM GOAL #2  ? Title The patient will report a 70% improvement in right LE symptoms with walking   ? Time 12   ? Period Weeks   ? Status On-going   ?  ? PT LONG TERM GOAL #3  ? Title The patient will be able to walk her dog medium distances   ? Status Achieved   ?  ? PT LONG TERM GOAL #4  ? Title The patient will have 4+/5 to 5-/5 lumbo pelvic hip strength needed for lifting medium weight objects   ? Time 12   ? Period Weeks   ? Status On-going   ?  ? PT LONG TERM GOAL #5  ? Title FOTO score improved to 69% indicating improved function with less pain   ? Time 12   ? Period Weeks   ? Status On-going   ? ?  ?  ? ?  ? ? ? ? ? ? ? ? Plan - 09/16/21 1321   ? ? Clinical  Impression Statement The patient is overall doing well despite return to gardening.  Doing well with this using SI support and back brace.  Discussed progressions of core strengthening.  Right LE still sensitive and somewhat reactive to neural tension but "not enough to stop" per patient.  She is undergoing another ESI and will follow up and provide additional progressions as needed.   ? Personal Factors and Comorbidities Comorbidity 1;Time since onset of injury/illness/exacerbation   ? Comorbidities osteopenia   ? Examination-Activity Limitations Locomotion Level;Lift;Stand;Sleep   ? Rehab Potential Good   ? PT Frequency Biweekly   ? PT Duration 12 weeks   ? PT Treatment/Interventions ADLs/Self Care Home Management;Aquatic Therapy;Cryotherapy;Electrical Stimulation;Ultrasound;Traction;Moist Heat;Iontophoresis '4mg'$ /ml Dexamethasone;Neuromuscular re-education;Therapeutic exercise;Therapeutic activities;Patient/family education;Manual techniques;Dry needling;Taping;Spinal Manipulations   ? PT Next Visit Plan follow up in 2-3 weeks with core progression and right glute med single leg progression as tolerated; check FOTO   ? PT Home Exercise Plan Access Code: [email or web address]   ? ?  ?  ? ?  ? ? ?Patient will benefit from skilled therapeutic intervention in order to improve the following deficits and impairments:  Difficulty walking, Pain, Increased fascial restricitons, Impaired perceived functional ability, Decreased activity tolerance, Decreased strength ? ?Visit Diagnosis: ?Radiculopathy, lumbar region ? ?Sciatica, right side ? ?Muscle weakness (generalized) ? ? ? ? ?Problem List ?Patient Active Problem List  ? Diagnosis Date Noted  ? SHOULDER STRAIN, RIGHT 03/20/2010  ? ? ?Alvera Singh, PT ?09/16/2021, 5:11 PM ? ?[facility] ?[facility] ?[place], [place], [address] ?Phone: [phone]   Fax:  [phone] ? ?Name: Renee Montgomery ?MRN: [id number] ?Date of Birth:  [birth year] ? ? ? ?

## 2021-09-18 ENCOUNTER — Other Ambulatory Visit (HOSPITAL_COMMUNITY): Payer: Self-pay

## 2021-09-22 DIAGNOSIS — M5416 Radiculopathy, lumbar region: Secondary | ICD-10-CM | POA: Diagnosis not present

## 2021-10-14 DIAGNOSIS — M4316 Spondylolisthesis, lumbar region: Secondary | ICD-10-CM | POA: Diagnosis not present

## 2021-10-14 DIAGNOSIS — Z6825 Body mass index (BMI) 25.0-25.9, adult: Secondary | ICD-10-CM | POA: Diagnosis not present

## 2021-10-16 DIAGNOSIS — D485 Neoplasm of uncertain behavior of skin: Secondary | ICD-10-CM | POA: Diagnosis not present

## 2021-10-16 DIAGNOSIS — Z85828 Personal history of other malignant neoplasm of skin: Secondary | ICD-10-CM | POA: Diagnosis not present

## 2021-10-16 DIAGNOSIS — D2261 Melanocytic nevi of right upper limb, including shoulder: Secondary | ICD-10-CM | POA: Diagnosis not present

## 2021-10-16 DIAGNOSIS — L820 Inflamed seborrheic keratosis: Secondary | ICD-10-CM | POA: Diagnosis not present

## 2021-10-16 DIAGNOSIS — D1801 Hemangioma of skin and subcutaneous tissue: Secondary | ICD-10-CM | POA: Diagnosis not present

## 2021-10-16 DIAGNOSIS — C44519 Basal cell carcinoma of skin of other part of trunk: Secondary | ICD-10-CM | POA: Diagnosis not present

## 2021-10-16 DIAGNOSIS — D225 Melanocytic nevi of trunk: Secondary | ICD-10-CM | POA: Diagnosis not present

## 2021-10-16 DIAGNOSIS — R208 Other disturbances of skin sensation: Secondary | ICD-10-CM | POA: Diagnosis not present

## 2021-10-16 DIAGNOSIS — L821 Other seborrheic keratosis: Secondary | ICD-10-CM | POA: Diagnosis not present

## 2021-10-21 ENCOUNTER — Ambulatory Visit: Payer: PPO | Attending: Family Medicine | Admitting: Physical Therapy

## 2021-10-21 DIAGNOSIS — M5416 Radiculopathy, lumbar region: Secondary | ICD-10-CM | POA: Diagnosis not present

## 2021-10-21 DIAGNOSIS — M6281 Muscle weakness (generalized): Secondary | ICD-10-CM | POA: Diagnosis not present

## 2021-10-21 DIAGNOSIS — M5431 Sciatica, right side: Secondary | ICD-10-CM | POA: Diagnosis not present

## 2021-10-21 NOTE — Therapy (Signed)
[facility] [place], [place], [address] Phone: [phone]   Fax:  [phone]  Physical Therapy Treatment/Discharge Summary   Patient Details  Name: Renee Montgomery MRN: [id number] Date of Birth: Oct 24, 1950 Referring Provider (PT): Dr. Lujean Amel   Encounter Date: 10/21/2021   PT End of Session - 10/21/21 1103     Visit Number 25    Date for PT Re-Evaluation 10/28/21    Authorization Type Medicare 30thvisit progress note    PT Start Time 1103    PT Stop Time 1138   discharge   PT Time Calculation (min) 35 min    Activity Tolerance Patient tolerated treatment well             Past Medical History:  Diagnosis Date   Anxiety    Thyroid disease    Hypo    Past Surgical History:  Procedure Laterality Date   AUGMENTATION MAMMAPLASTY Bilateral    BREAST ENHANCEMENT SURGERY  2003   FOREARM FRACTURE SURGERY  5726   left   UMBILICAL HERNIA REPAIR  1990    There were no vitals filed for this visit.   Subjective Assessment - 10/21/21 1103     Subjective Had 2nd injection.  It really helped. Still wearing belt 5 hours/day.    Pertinent History osteopenia; saw Dr. 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 No/denies    Pain Score 0-No pain    Pain Location Back                [facility] PT Assessment - 10/21/21 0001       Observation/Other Assessments   Focus on Therapeutic Outcomes (FOTO)  57%      AROM   Lumbar Flexion 80    Lumbar Extension 40    Lumbar - Right Side Bend 45    Lumbar - Left Side Bend 45      Strength   Overall Strength Comments pelvic drop in quadruped    Right Hip Extension 4+/5    Right Hip ABduction 4+/5    Left Hip Extension 4+/5    Left Hip ABduction 4+/5     Lumbar Flexion 4+/5    Lumbar Extension 4+/5                           [facility] Adult PT Treatment/Exercise - 10/21/21 0001       Lumbar Exercises: Seated   Other Seated Lumbar Exercises discussion of safe self progression of HEP      Lumbar Exercises: Prone   Other Prone Lumbar Exercises modified plank; discussed progression to toes/elbows      Lumbar Exercises: Quadruped   Opposite Arm/Leg Raise Right arm/Left leg;Left arm/Right leg;5 reps                       PT Short Term Goals - 10/21/21 1118       PT SHORT TERM GOAL #1   Title The patient will demonstrate basic self care and ex's to relieve LE symptoms    Status Achieved      PT SHORT TERM GOAL #2   Title The patient will report a 30%  reduction in right LE symptoms with walking medium distances    Status Achieved      PT SHORT TERM GOAL #3   Title The patient will have improved bil lumbo/pelvic/hip strength to grossly 4+/5 needed for standing to cook and ascend steps    Status Achieved               PT Long Term Goals - 10/21/21 1112       PT LONG TERM GOAL #1   Title The patient will be independent with safe self progression of HEP    Status Achieved      PT LONG TERM GOAL #2   Title The patient will report a 70% improvement in right LE symptoms with walking    Status Achieved      PT LONG TERM GOAL #3   Title The patient will be able to walk her dog medium distances    Status Achieved      PT LONG TERM GOAL #4   Title The patient will have 4+/5 to 5-/5 lumbo pelvic hip strength needed for lifting medium weight objects    Status Achieved      PT LONG TERM GOAL #5   Title FOTO score improved to 69% indicating improved function with less pain    Status Partially Met                   Plan - 10/21/21 1143     Clinical Impression Statement The patient rates her progress at 75%.  She is now able to go in a few stores and walk fairly comfortably for 30  minutes.  Her core and right hip strength has significantly improved to at least 4+/5.   FOTO score has improved to 57%.  She is independent in a safe self progression of a HEP and the majority of goals have been met.  Recommend discharge from PT at this time.    Personal Factors and Comorbidities Comorbidity 1;Time since onset of injury/illness/exacerbation    Examination-Activity Limitations Locomotion Level;Lift;Stand;Sleep    Rehab Potential Good             Patient will benefit from skilled therapeutic intervention in order to improve the following deficits and impairments:  Difficulty walking, Pain, Increased fascial restricitons, Impaired perceived functional ability, Decreased activity tolerance, Decreased strength  Visit Diagnosis: Radiculopathy, lumbar region  Sciatica, right side  Muscle weakness (generalized)  PHYSICAL THERAPY DISCHARGE SUMMARY  Visits from Start of Care: 25  Current functional level related to goals / functional outcomes: See clinical impressions above   Remaining deficits: As above   Education / Equipment: Comprehensive HEP   Patient agrees to discharge. Patient goals were met. Patient is being discharged due to meeting the stated rehab goals.    Problem List Patient Active Problem List   Diagnosis Date Noted   SHOULDER STRAIN, RIGHT 03/20/2010    Alvera Singh, PT 10/21/2021, 5:41 PM  [facility] [place], [place], [address] Phone: [phone]   Fax:  [phone]  Name: Renee Montgomery MRN: [id number] Date of Birth: October 29, 1950

## 2021-10-23 ENCOUNTER — Other Ambulatory Visit (HOSPITAL_COMMUNITY): Payer: Self-pay

## 2021-11-20 DIAGNOSIS — M9903 Segmental and somatic dysfunction of lumbar region: Secondary | ICD-10-CM | POA: Diagnosis not present

## 2021-11-20 DIAGNOSIS — M9904 Segmental and somatic dysfunction of sacral region: Secondary | ICD-10-CM | POA: Diagnosis not present

## 2021-11-20 DIAGNOSIS — M9905 Segmental and somatic dysfunction of pelvic region: Secondary | ICD-10-CM | POA: Diagnosis not present

## 2021-11-20 DIAGNOSIS — M5136 Other intervertebral disc degeneration, lumbar region: Secondary | ICD-10-CM | POA: Diagnosis not present

## 2021-12-09 ENCOUNTER — Other Ambulatory Visit (HOSPITAL_COMMUNITY): Payer: Self-pay

## 2022-01-13 DIAGNOSIS — M5416 Radiculopathy, lumbar region: Secondary | ICD-10-CM | POA: Diagnosis not present

## 2022-01-13 DIAGNOSIS — Z6825 Body mass index (BMI) 25.0-25.9, adult: Secondary | ICD-10-CM | POA: Diagnosis not present

## 2022-03-17 ENCOUNTER — Other Ambulatory Visit (HOSPITAL_COMMUNITY): Payer: Self-pay

## 2022-03-20 ENCOUNTER — Other Ambulatory Visit (HOSPITAL_COMMUNITY): Payer: Self-pay

## 2022-03-25 ENCOUNTER — Other Ambulatory Visit (HOSPITAL_COMMUNITY): Payer: Self-pay

## 2022-04-07 DIAGNOSIS — N951 Menopausal and female climacteric states: Secondary | ICD-10-CM | POA: Diagnosis not present

## 2022-04-09 ENCOUNTER — Encounter: Payer: Self-pay | Admitting: Internal Medicine

## 2022-06-09 DIAGNOSIS — M9903 Segmental and somatic dysfunction of lumbar region: Secondary | ICD-10-CM | POA: Diagnosis not present

## 2022-06-09 DIAGNOSIS — M9905 Segmental and somatic dysfunction of pelvic region: Secondary | ICD-10-CM | POA: Diagnosis not present

## 2022-06-09 DIAGNOSIS — M9904 Segmental and somatic dysfunction of sacral region: Secondary | ICD-10-CM | POA: Diagnosis not present

## 2022-06-09 DIAGNOSIS — M5136 Other intervertebral disc degeneration, lumbar region: Secondary | ICD-10-CM | POA: Diagnosis not present

## 2022-06-16 ENCOUNTER — Other Ambulatory Visit (HOSPITAL_COMMUNITY): Payer: Self-pay

## 2022-06-16 DIAGNOSIS — M4316 Spondylolisthesis, lumbar region: Secondary | ICD-10-CM | POA: Diagnosis not present

## 2022-06-17 ENCOUNTER — Other Ambulatory Visit: Payer: Self-pay | Admitting: Neurosurgery

## 2022-06-17 DIAGNOSIS — M4316 Spondylolisthesis, lumbar region: Secondary | ICD-10-CM

## 2022-06-27 ENCOUNTER — Ambulatory Visit
Admission: RE | Admit: 2022-06-27 | Discharge: 2022-06-27 | Disposition: A | Discharge status: Home / Self Care | Payer: PPO | Source: Ambulatory Visit | Attending: Neurosurgery | Admitting: Neurosurgery

## 2022-06-27 DIAGNOSIS — M545 Low back pain, unspecified: Secondary | ICD-10-CM | POA: Diagnosis not present

## 2022-06-27 DIAGNOSIS — M48061 Spinal stenosis, lumbar region without neurogenic claudication: Secondary | ICD-10-CM | POA: Diagnosis not present

## 2022-06-27 DIAGNOSIS — R200 Anesthesia of skin: Secondary | ICD-10-CM | POA: Diagnosis not present

## 2022-06-27 DIAGNOSIS — M4316 Spondylolisthesis, lumbar region: Secondary | ICD-10-CM

## 2022-07-02 DIAGNOSIS — M4316 Spondylolisthesis, lumbar region: Secondary | ICD-10-CM | POA: Diagnosis not present

## 2022-07-07 DIAGNOSIS — M5416 Radiculopathy, lumbar region: Secondary | ICD-10-CM | POA: Diagnosis not present

## 2022-07-15 ENCOUNTER — Other Ambulatory Visit: Payer: Self-pay | Admitting: Family Medicine

## 2022-07-15 DIAGNOSIS — G4700 Insomnia, unspecified: Secondary | ICD-10-CM | POA: Diagnosis not present

## 2022-07-15 DIAGNOSIS — E2839 Other primary ovarian failure: Secondary | ICD-10-CM

## 2022-07-15 DIAGNOSIS — F419 Anxiety disorder, unspecified: Secondary | ICD-10-CM | POA: Diagnosis not present

## 2022-07-15 DIAGNOSIS — Z0001 Encounter for general adult medical examination with abnormal findings: Secondary | ICD-10-CM | POA: Diagnosis not present

## 2022-07-15 DIAGNOSIS — E039 Hypothyroidism, unspecified: Secondary | ICD-10-CM | POA: Diagnosis not present

## 2022-07-15 DIAGNOSIS — Z79899 Other long term (current) drug therapy: Secondary | ICD-10-CM | POA: Diagnosis not present

## 2022-07-16 ENCOUNTER — Other Ambulatory Visit: Payer: Self-pay | Admitting: Obstetrics and Gynecology

## 2022-07-16 DIAGNOSIS — Z1231 Encounter for screening mammogram for malignant neoplasm of breast: Secondary | ICD-10-CM

## 2022-07-17 ENCOUNTER — Other Ambulatory Visit: Payer: Self-pay | Admitting: Obstetrics and Gynecology

## 2022-07-17 DIAGNOSIS — Z1231 Encounter for screening mammogram for malignant neoplasm of breast: Secondary | ICD-10-CM

## 2022-07-20 ENCOUNTER — Ambulatory Visit
Admission: RE | Admit: 2022-07-20 | Discharge: 2022-07-20 | Disposition: A | Discharge status: Home / Self Care | Payer: PPO | Source: Ambulatory Visit | Attending: Obstetrics and Gynecology | Admitting: Obstetrics and Gynecology

## 2022-07-20 DIAGNOSIS — Z1231 Encounter for screening mammogram for malignant neoplasm of breast: Secondary | ICD-10-CM | POA: Diagnosis not present

## 2022-08-04 DIAGNOSIS — Z6825 Body mass index (BMI) 25.0-25.9, adult: Secondary | ICD-10-CM | POA: Diagnosis not present

## 2022-08-04 DIAGNOSIS — M4316 Spondylolisthesis, lumbar region: Secondary | ICD-10-CM | POA: Diagnosis not present

## 2022-08-04 DIAGNOSIS — M5416 Radiculopathy, lumbar region: Secondary | ICD-10-CM | POA: Diagnosis not present

## 2022-08-06 DIAGNOSIS — M9905 Segmental and somatic dysfunction of pelvic region: Secondary | ICD-10-CM | POA: Diagnosis not present

## 2022-08-06 DIAGNOSIS — M9904 Segmental and somatic dysfunction of sacral region: Secondary | ICD-10-CM | POA: Diagnosis not present

## 2022-08-06 DIAGNOSIS — M9903 Segmental and somatic dysfunction of lumbar region: Secondary | ICD-10-CM | POA: Diagnosis not present

## 2022-08-06 DIAGNOSIS — M5136 Other intervertebral disc degeneration, lumbar region: Secondary | ICD-10-CM | POA: Diagnosis not present

## 2022-08-27 DIAGNOSIS — M5136 Other intervertebral disc degeneration, lumbar region: Secondary | ICD-10-CM | POA: Diagnosis not present

## 2022-08-27 DIAGNOSIS — M9903 Segmental and somatic dysfunction of lumbar region: Secondary | ICD-10-CM | POA: Diagnosis not present

## 2022-08-27 DIAGNOSIS — M9904 Segmental and somatic dysfunction of sacral region: Secondary | ICD-10-CM | POA: Diagnosis not present

## 2022-08-27 DIAGNOSIS — M9905 Segmental and somatic dysfunction of pelvic region: Secondary | ICD-10-CM | POA: Diagnosis not present

## 2022-09-09 DIAGNOSIS — M5136 Other intervertebral disc degeneration, lumbar region: Secondary | ICD-10-CM | POA: Diagnosis not present

## 2022-09-09 DIAGNOSIS — M9904 Segmental and somatic dysfunction of sacral region: Secondary | ICD-10-CM | POA: Diagnosis not present

## 2022-09-09 DIAGNOSIS — M9903 Segmental and somatic dysfunction of lumbar region: Secondary | ICD-10-CM | POA: Diagnosis not present

## 2022-09-09 DIAGNOSIS — M9905 Segmental and somatic dysfunction of pelvic region: Secondary | ICD-10-CM | POA: Diagnosis not present

## 2022-10-07 DIAGNOSIS — M9903 Segmental and somatic dysfunction of lumbar region: Secondary | ICD-10-CM | POA: Diagnosis not present

## 2022-10-07 DIAGNOSIS — M9905 Segmental and somatic dysfunction of pelvic region: Secondary | ICD-10-CM | POA: Diagnosis not present

## 2022-10-07 DIAGNOSIS — M5136 Other intervertebral disc degeneration, lumbar region: Secondary | ICD-10-CM | POA: Diagnosis not present

## 2022-10-07 DIAGNOSIS — M9904 Segmental and somatic dysfunction of sacral region: Secondary | ICD-10-CM | POA: Diagnosis not present

## 2022-10-13 DIAGNOSIS — M9904 Segmental and somatic dysfunction of sacral region: Secondary | ICD-10-CM | POA: Diagnosis not present

## 2022-10-13 DIAGNOSIS — M9903 Segmental and somatic dysfunction of lumbar region: Secondary | ICD-10-CM | POA: Diagnosis not present

## 2022-10-13 DIAGNOSIS — M5136 Other intervertebral disc degeneration, lumbar region: Secondary | ICD-10-CM | POA: Diagnosis not present

## 2022-10-13 DIAGNOSIS — M9905 Segmental and somatic dysfunction of pelvic region: Secondary | ICD-10-CM | POA: Diagnosis not present

## 2022-10-29 ENCOUNTER — Other Ambulatory Visit: Payer: Self-pay | Admitting: Neurosurgery

## 2022-10-29 DIAGNOSIS — M25552 Pain in left hip: Secondary | ICD-10-CM

## 2022-11-01 ENCOUNTER — Ambulatory Visit
Admission: RE | Admit: 2022-11-01 | Discharge: 2022-11-01 | Disposition: A | Discharge status: Home / Self Care | Payer: PPO | Source: Ambulatory Visit | Attending: Neurosurgery | Admitting: Neurosurgery

## 2022-11-01 DIAGNOSIS — M25552 Pain in left hip: Secondary | ICD-10-CM

## 2022-11-10 DIAGNOSIS — M4316 Spondylolisthesis, lumbar region: Secondary | ICD-10-CM | POA: Diagnosis not present

## 2022-11-16 DIAGNOSIS — M47816 Spondylosis without myelopathy or radiculopathy, lumbar region: Secondary | ICD-10-CM | POA: Diagnosis not present

## 2022-11-20 DIAGNOSIS — L814 Other melanin hyperpigmentation: Secondary | ICD-10-CM | POA: Diagnosis not present

## 2022-11-20 DIAGNOSIS — L218 Other seborrheic dermatitis: Secondary | ICD-10-CM | POA: Diagnosis not present

## 2022-11-20 DIAGNOSIS — D2271 Melanocytic nevi of right lower limb, including hip: Secondary | ICD-10-CM | POA: Diagnosis not present

## 2022-11-20 DIAGNOSIS — D225 Melanocytic nevi of trunk: Secondary | ICD-10-CM | POA: Diagnosis not present

## 2022-11-20 DIAGNOSIS — D1801 Hemangioma of skin and subcutaneous tissue: Secondary | ICD-10-CM | POA: Diagnosis not present

## 2022-11-20 DIAGNOSIS — Z85828 Personal history of other malignant neoplasm of skin: Secondary | ICD-10-CM | POA: Diagnosis not present

## 2022-11-20 DIAGNOSIS — D2272 Melanocytic nevi of left lower limb, including hip: Secondary | ICD-10-CM | POA: Diagnosis not present

## 2022-11-20 DIAGNOSIS — L821 Other seborrheic keratosis: Secondary | ICD-10-CM | POA: Diagnosis not present

## 2022-12-10 DIAGNOSIS — M4316 Spondylolisthesis, lumbar region: Secondary | ICD-10-CM | POA: Diagnosis not present

## 2022-12-16 DIAGNOSIS — M9904 Segmental and somatic dysfunction of sacral region: Secondary | ICD-10-CM | POA: Diagnosis not present

## 2022-12-16 DIAGNOSIS — M9903 Segmental and somatic dysfunction of lumbar region: Secondary | ICD-10-CM | POA: Diagnosis not present

## 2022-12-16 DIAGNOSIS — M9905 Segmental and somatic dysfunction of pelvic region: Secondary | ICD-10-CM | POA: Diagnosis not present

## 2022-12-16 DIAGNOSIS — M5136 Other intervertebral disc degeneration, lumbar region: Secondary | ICD-10-CM | POA: Diagnosis not present

## 2022-12-23 DIAGNOSIS — M9904 Segmental and somatic dysfunction of sacral region: Secondary | ICD-10-CM | POA: Diagnosis not present

## 2022-12-23 DIAGNOSIS — M9903 Segmental and somatic dysfunction of lumbar region: Secondary | ICD-10-CM | POA: Diagnosis not present

## 2022-12-23 DIAGNOSIS — M5136 Other intervertebral disc degeneration, lumbar region: Secondary | ICD-10-CM | POA: Diagnosis not present

## 2022-12-23 DIAGNOSIS — M9905 Segmental and somatic dysfunction of pelvic region: Secondary | ICD-10-CM | POA: Diagnosis not present

## 2022-12-31 ENCOUNTER — Other Ambulatory Visit: Payer: Self-pay

## 2022-12-31 DIAGNOSIS — I8390 Asymptomatic varicose veins of unspecified lower extremity: Secondary | ICD-10-CM

## 2023-01-06 DIAGNOSIS — M9904 Segmental and somatic dysfunction of sacral region: Secondary | ICD-10-CM | POA: Diagnosis not present

## 2023-01-06 DIAGNOSIS — M5136 Other intervertebral disc degeneration, lumbar region: Secondary | ICD-10-CM | POA: Diagnosis not present

## 2023-01-06 DIAGNOSIS — M9903 Segmental and somatic dysfunction of lumbar region: Secondary | ICD-10-CM | POA: Diagnosis not present

## 2023-01-06 DIAGNOSIS — M9905 Segmental and somatic dysfunction of pelvic region: Secondary | ICD-10-CM | POA: Diagnosis not present

## 2023-01-07 ENCOUNTER — Ambulatory Visit
Admission: RE | Admit: 2023-01-07 | Discharge: 2023-01-07 | Disposition: A | Discharge status: Home / Self Care | Payer: PPO | Source: Ambulatory Visit | Attending: Family Medicine | Admitting: Family Medicine

## 2023-01-07 DIAGNOSIS — M8588 Other specified disorders of bone density and structure, other site: Secondary | ICD-10-CM | POA: Diagnosis not present

## 2023-01-07 DIAGNOSIS — N958 Other specified menopausal and perimenopausal disorders: Secondary | ICD-10-CM | POA: Diagnosis not present

## 2023-01-07 DIAGNOSIS — E349 Endocrine disorder, unspecified: Secondary | ICD-10-CM | POA: Diagnosis not present

## 2023-01-07 DIAGNOSIS — E2839 Other primary ovarian failure: Secondary | ICD-10-CM

## 2023-01-13 ENCOUNTER — Encounter: Payer: Self-pay | Admitting: Vascular Surgery

## 2023-01-13 ENCOUNTER — Ambulatory Visit: Payer: PPO | Admitting: Vascular Surgery

## 2023-01-13 ENCOUNTER — Ambulatory Visit (HOSPITAL_COMMUNITY)
Admission: RE | Admit: 2023-01-13 | Discharge: 2023-01-13 | Disposition: A | Discharge status: Home / Self Care | Payer: PPO | Source: Ambulatory Visit | Attending: Surgery | Admitting: Surgery

## 2023-01-13 VITALS — BP 101/66 | HR 74 | Temp 98.8°F | Resp 18 | Ht 63.0 in | Wt 138.0 lb

## 2023-01-13 DIAGNOSIS — I8391 Asymptomatic varicose veins of right lower extremity: Secondary | ICD-10-CM | POA: Diagnosis not present

## 2023-01-13 DIAGNOSIS — I872 Venous insufficiency (chronic) (peripheral): Secondary | ICD-10-CM

## 2023-01-13 DIAGNOSIS — I8390 Asymptomatic varicose veins of unspecified lower extremity: Secondary | ICD-10-CM | POA: Insufficient documentation

## 2023-01-13 NOTE — Progress Notes (Signed)
ASSESSMENT & PLAN   CHRONIC VENOUS INSUFFICIENCY: This patient has CEAP C1 venous disease (telangiectasias).  We have discussed conservative measures.  I have encouraged her to avoid prolonged sitting and standing.  We have discussed the importance of exercise specifically walking and water aerobics.  I have recommended that she continue wearing her compression stockings.  We have discussed the importance of daily leg elevation and the proper positioning for this.  Fortunately she has a healthy weight.  She would be a candidate for sclerotherapy if she elected to address her telangiectasias.  I have given her the number for Cherene Julian, RN who does our sclerotherapy here.  She will call if she would like to consider sclerotherapy.  If her symptoms progress in the future or she develops large dilated varicose veins we can reevaluate her.  She has not had formal venous reflux testing on the left.  REASON FOR CONSULT:    Varicose veins.  The consult is requested by Theador Hawthorne, MD  HPI:   Renee Montgomery is a 72 y.o. female who was referred with varicose veins.  I have reviewed the records from the referring office.   On my history the patient describes spider veins in both lower extremities which she has had for many years.  She has had sclerotherapy treatment 3 times in the past.  This was many years ago.  She has had no other venous procedures.  She does describe some aching pain and heaviness in her legs which is aggravated by standing and sitting and relieved somewhat with elevation.  She also wears compression stockings which help with her symptoms.  She denies any previous history of DVT or phlebitis.  Past Medical History:  Diagnosis Date   Anxiety    Thyroid disease    Hypo    Family History  Problem Relation Age of Onset   Breast cancer Mother 73    SOCIAL HISTORY: Social History   Tobacco Use   Smoking status: Never   Smokeless tobacco: Never  Substance Use Topics    Alcohol use: Yes    Alcohol/week: 7.0 standard drinks of alcohol    Types: 7 Glasses of wine per week    No Known Allergies  Current Outpatient Medications  Medication Sig Dispense Refill   ALPRAZolam (XANAX) 0.25 MG tablet Take 1 tablet by mouth once a day if needed for anxiety/panic (not a daily medication, avoid regular use) 90 tablet 0   diazepam (VALIUM) 5 MG tablet Take 1 tablet by mouth 30 - 45 minutes prior to procedure 1 tablet 0   estradiol (ESTRACE) 1 MG tablet Take 1 mg by mouth daily.     estradiol-norethindrone (MIMVEY) 1-0.5 MG tablet TAKE 1 TABLET BY MOUTH ONCE DAILY 84 tablet 3   levothyroxine (SYNTHROID) 100 MCG tablet TAKE 1 TABLET BY MOUTH EVERY MORNING ON AN EMPTY STOMACH 90 tablet 3   levothyroxine (SYNTHROID, LEVOTHROID) 100 MCG tablet Take 100 mcg by mouth daily.     Multiple Vitamins-Minerals (MULTIVITAMIN WITH MINERALS) tablet Take 1 tablet by mouth daily.     traMADol (ULTRAM) 50 MG tablet Take by mouth every 6 (six) hours as needed for severe pain.     zolpidem (AMBIEN) 10 MG tablet Take 5 mg by mouth at bedtime as needed.     zolpidem (AMBIEN) 10 MG tablet Take 1 tablet by mouth at bedtime as needed for insomnia (avoid regular use, not a daily medication) 90 tablet 0   ALPRAZolam (XANAX) 0.25  MG tablet Take 0.25 mg by mouth at bedtime as needed. (Patient not taking: Reported on 03/04/2021)     estradiol (ESTRACE) 0.1 MG/GM vaginal cream Place 1 g vaginally 2 (two) times a week.     estradiol-norethindrone (ACTIVELLA) 1-0.5 MG tablet TAKE 1 TABLET BY MOUTH ONCE DAILY 28 tablet 0   fish oil-omega-3 fatty acids 1000 MG capsule Take 1 g by mouth daily.     levothyroxine (SYNTHROID) 100 MCG tablet TAKE 1 TABLET BY MOUTH EVERY MORNING ON AN EMPTY STOMACH 90 tablet 2   zolpidem (AMBIEN) 10 MG tablet TAKE 1 TABLET BY MOUTH EVERY NIGHT AT BEDTIME AS NEEDED FOR INSOMNIA ( AVOID REGULAR USE, NOT A DAILY MEDICATION) 90 tablet 0   No current facility-administered  medications for this visit.    REVIEW OF SYSTEMS:  [X]  denotes positive finding, [ ]  denotes negative finding Cardiac  Comments:  Chest pain or chest pressure:    Shortness of breath upon exertion:    Short of breath when lying flat:    Irregular heart rhythm:        Vascular    Pain in calf, thigh, or hip brought on by ambulation:    Pain in feet at night that wakes you up from your sleep:     Blood clot in your veins:    Leg swelling:         Pulmonary    Oxygen at home:    Productive cough:     Wheezing:         Neurologic    Sudden weakness in arms or legs:     Sudden numbness in arms or legs:     Sudden onset of difficulty speaking or slurred speech:    Temporary loss of vision in one eye:     Problems with dizziness:         Gastrointestinal    Blood in stool:     Vomited blood:         Genitourinary    Burning when urinating:     Blood in urine:        Psychiatric    Major depression:         Hematologic    Bleeding problems:    Problems with blood clotting too easily:        Skin    Rashes or ulcers:        Constitutional    Fever or chills:    -  PHYSICAL EXAM:   Vitals:   01/13/23 1518  BP: 101/66  Pulse: 74  Resp: 18  Temp: 98.8 F (37.1 C)  TempSrc: Temporal  SpO2: 97%  Weight: 138 lb (62.6 kg)  Height: 5\' 3"  (1.6 m)   Body mass index is 24.45 kg/m. GENERAL: The patient is a well-nourished female, in no acute distress. The vital signs are documented above. CARDIAC: There is a regular rate and rhythm.  VASCULAR: I do not detect carotid bruits. She has palpable pedal pulses. She has multiple areas of telangiectasias but no large varicosities.           I did look at the superficial veins on both sides.  On the right side the great saphenous vein is fairly small.  She does appear to have an anterior accessory saphenous vein which is not significantly dilated.  On the left side likewise she had some dilation of her left great  saphenous vein and also an anterior accessory saphenous vein on the left.  This was moderately dilated.  PULMONARY: There is good air exchange bilaterally without wheezing or rales. ABDOMEN: Soft and non-tender with normal pitched bowel sounds.  MUSCULOSKELETAL: There are no major deformities. NEUROLOGIC: No focal weakness or paresthesias are detected. SKIN: There are no ulcers or rashes noted. PSYCHIATRIC: The patient has a normal affect.  DATA:    VENOUS DUPLEX: I have reviewed the venous duplex scan that today.  This was of the right lower extremity only.  There was no evidence of DVT.  There was deep venous reflux in the common femoral vein.  There was no significant superficial venous reflux on the right.  Waverly Ferrari [facility]

## 2023-01-21 DIAGNOSIS — M5136 Other intervertebral disc degeneration, lumbar region: Secondary | ICD-10-CM | POA: Diagnosis not present

## 2023-01-21 DIAGNOSIS — M9903 Segmental and somatic dysfunction of lumbar region: Secondary | ICD-10-CM | POA: Diagnosis not present

## 2023-01-21 DIAGNOSIS — M9905 Segmental and somatic dysfunction of pelvic region: Secondary | ICD-10-CM | POA: Diagnosis not present

## 2023-01-21 DIAGNOSIS — M9904 Segmental and somatic dysfunction of sacral region: Secondary | ICD-10-CM | POA: Diagnosis not present

## 2023-01-27 DIAGNOSIS — M9905 Segmental and somatic dysfunction of pelvic region: Secondary | ICD-10-CM | POA: Diagnosis not present

## 2023-01-27 DIAGNOSIS — M9904 Segmental and somatic dysfunction of sacral region: Secondary | ICD-10-CM | POA: Diagnosis not present

## 2023-01-27 DIAGNOSIS — M9903 Segmental and somatic dysfunction of lumbar region: Secondary | ICD-10-CM | POA: Diagnosis not present

## 2023-01-27 DIAGNOSIS — M5136 Other intervertebral disc degeneration, lumbar region: Secondary | ICD-10-CM | POA: Diagnosis not present

## 2023-02-10 DIAGNOSIS — M9903 Segmental and somatic dysfunction of lumbar region: Secondary | ICD-10-CM | POA: Diagnosis not present

## 2023-02-10 DIAGNOSIS — M9904 Segmental and somatic dysfunction of sacral region: Secondary | ICD-10-CM | POA: Diagnosis not present

## 2023-02-10 DIAGNOSIS — M5136 Other intervertebral disc degeneration, lumbar region with discogenic back pain only: Secondary | ICD-10-CM | POA: Diagnosis not present

## 2023-02-10 DIAGNOSIS — M9905 Segmental and somatic dysfunction of pelvic region: Secondary | ICD-10-CM | POA: Diagnosis not present

## 2023-03-04 DIAGNOSIS — M9903 Segmental and somatic dysfunction of lumbar region: Secondary | ICD-10-CM | POA: Diagnosis not present

## 2023-03-04 DIAGNOSIS — M9905 Segmental and somatic dysfunction of pelvic region: Secondary | ICD-10-CM | POA: Diagnosis not present

## 2023-03-04 DIAGNOSIS — M5136 Other intervertebral disc degeneration, lumbar region with discogenic back pain only: Secondary | ICD-10-CM | POA: Diagnosis not present

## 2023-03-04 DIAGNOSIS — M9904 Segmental and somatic dysfunction of sacral region: Secondary | ICD-10-CM | POA: Diagnosis not present

## 2023-03-18 DIAGNOSIS — M9905 Segmental and somatic dysfunction of pelvic region: Secondary | ICD-10-CM | POA: Diagnosis not present

## 2023-03-18 DIAGNOSIS — M9903 Segmental and somatic dysfunction of lumbar region: Secondary | ICD-10-CM | POA: Diagnosis not present

## 2023-03-18 DIAGNOSIS — M5136 Other intervertebral disc degeneration, lumbar region with discogenic back pain only: Secondary | ICD-10-CM | POA: Diagnosis not present

## 2023-03-18 DIAGNOSIS — M9904 Segmental and somatic dysfunction of sacral region: Secondary | ICD-10-CM | POA: Diagnosis not present

## 2023-04-01 DIAGNOSIS — Z01411 Encounter for gynecological examination (general) (routine) with abnormal findings: Secondary | ICD-10-CM | POA: Diagnosis not present

## 2023-04-01 DIAGNOSIS — R8290 Unspecified abnormal findings in urine: Secondary | ICD-10-CM | POA: Diagnosis not present

## 2023-04-28 DIAGNOSIS — M9905 Segmental and somatic dysfunction of pelvic region: Secondary | ICD-10-CM | POA: Diagnosis not present

## 2023-04-28 DIAGNOSIS — M9903 Segmental and somatic dysfunction of lumbar region: Secondary | ICD-10-CM | POA: Diagnosis not present

## 2023-04-28 DIAGNOSIS — M5136 Other intervertebral disc degeneration, lumbar region with discogenic back pain only: Secondary | ICD-10-CM | POA: Diagnosis not present

## 2023-04-28 DIAGNOSIS — M9904 Segmental and somatic dysfunction of sacral region: Secondary | ICD-10-CM | POA: Diagnosis not present

## 2023-05-06 DIAGNOSIS — M9905 Segmental and somatic dysfunction of pelvic region: Secondary | ICD-10-CM | POA: Diagnosis not present

## 2023-05-06 DIAGNOSIS — M9903 Segmental and somatic dysfunction of lumbar region: Secondary | ICD-10-CM | POA: Diagnosis not present

## 2023-05-06 DIAGNOSIS — M5136 Other intervertebral disc degeneration, lumbar region with discogenic back pain only: Secondary | ICD-10-CM | POA: Diagnosis not present

## 2023-05-06 DIAGNOSIS — M9904 Segmental and somatic dysfunction of sacral region: Secondary | ICD-10-CM | POA: Diagnosis not present

## 2023-07-30 DIAGNOSIS — F419 Anxiety disorder, unspecified: Secondary | ICD-10-CM | POA: Diagnosis not present

## 2023-07-30 DIAGNOSIS — M5136 Other intervertebral disc degeneration, lumbar region with discogenic back pain only: Secondary | ICD-10-CM | POA: Diagnosis not present

## 2023-07-30 DIAGNOSIS — Z0000 Encounter for general adult medical examination without abnormal findings: Secondary | ICD-10-CM | POA: Diagnosis not present

## 2023-07-30 DIAGNOSIS — G4700 Insomnia, unspecified: Secondary | ICD-10-CM | POA: Diagnosis not present

## 2023-07-30 DIAGNOSIS — Z1211 Encounter for screening for malignant neoplasm of colon: Secondary | ICD-10-CM | POA: Diagnosis not present

## 2023-07-30 DIAGNOSIS — R1032 Left lower quadrant pain: Secondary | ICD-10-CM | POA: Diagnosis not present

## 2023-07-30 DIAGNOSIS — Z79899 Other long term (current) drug therapy: Secondary | ICD-10-CM | POA: Diagnosis not present

## 2023-07-30 DIAGNOSIS — R3129 Other microscopic hematuria: Secondary | ICD-10-CM | POA: Diagnosis not present

## 2023-07-30 DIAGNOSIS — E039 Hypothyroidism, unspecified: Secondary | ICD-10-CM | POA: Diagnosis not present

## 2023-07-30 DIAGNOSIS — E2839 Other primary ovarian failure: Secondary | ICD-10-CM | POA: Diagnosis not present

## 2023-08-04 ENCOUNTER — Other Ambulatory Visit: Payer: Self-pay | Admitting: Family Medicine

## 2023-08-04 DIAGNOSIS — R1032 Left lower quadrant pain: Secondary | ICD-10-CM

## 2023-08-09 DIAGNOSIS — H18213 Corneal edema secondary to contact lens, bilateral: Secondary | ICD-10-CM | POA: Diagnosis not present

## 2023-08-26 ENCOUNTER — Ambulatory Visit
Admission: RE | Admit: 2023-08-26 | Discharge: 2023-08-26 | Disposition: A | Discharge status: Home / Self Care | Source: Ambulatory Visit | Attending: Family Medicine | Admitting: Family Medicine

## 2023-08-26 DIAGNOSIS — D259 Leiomyoma of uterus, unspecified: Secondary | ICD-10-CM | POA: Diagnosis not present

## 2023-08-26 DIAGNOSIS — G8929 Other chronic pain: Secondary | ICD-10-CM | POA: Diagnosis not present

## 2023-08-26 DIAGNOSIS — R1032 Left lower quadrant pain: Secondary | ICD-10-CM | POA: Diagnosis not present

## 2023-08-26 DIAGNOSIS — K5730 Diverticulosis of large intestine without perforation or abscess without bleeding: Secondary | ICD-10-CM | POA: Diagnosis not present

## 2023-08-26 MED ORDER — IOPAMIDOL (ISOVUE-300) INJECTION 61%
500.0000 mL | Freq: Once | INTRAVENOUS | Status: AC | PRN
  Administered 2023-08-26: 80 mL via INTRAVENOUS

## 2023-08-31 DIAGNOSIS — M5136 Other intervertebral disc degeneration, lumbar region with discogenic back pain only: Secondary | ICD-10-CM | POA: Diagnosis not present

## 2023-08-31 DIAGNOSIS — M9905 Segmental and somatic dysfunction of pelvic region: Secondary | ICD-10-CM | POA: Diagnosis not present

## 2023-08-31 DIAGNOSIS — M9904 Segmental and somatic dysfunction of sacral region: Secondary | ICD-10-CM | POA: Diagnosis not present

## 2023-08-31 DIAGNOSIS — M9903 Segmental and somatic dysfunction of lumbar region: Secondary | ICD-10-CM | POA: Diagnosis not present

## 2023-09-21 DIAGNOSIS — M9904 Segmental and somatic dysfunction of sacral region: Secondary | ICD-10-CM | POA: Diagnosis not present

## 2023-09-21 DIAGNOSIS — M5136 Other intervertebral disc degeneration, lumbar region with discogenic back pain only: Secondary | ICD-10-CM | POA: Diagnosis not present

## 2023-09-21 DIAGNOSIS — M9903 Segmental and somatic dysfunction of lumbar region: Secondary | ICD-10-CM | POA: Diagnosis not present

## 2023-09-21 DIAGNOSIS — M9905 Segmental and somatic dysfunction of pelvic region: Secondary | ICD-10-CM | POA: Diagnosis not present

## 2023-09-22 DIAGNOSIS — M9905 Segmental and somatic dysfunction of pelvic region: Secondary | ICD-10-CM | POA: Diagnosis not present

## 2023-09-22 DIAGNOSIS — M5136 Other intervertebral disc degeneration, lumbar region with discogenic back pain only: Secondary | ICD-10-CM | POA: Diagnosis not present

## 2023-09-22 DIAGNOSIS — M9904 Segmental and somatic dysfunction of sacral region: Secondary | ICD-10-CM | POA: Diagnosis not present

## 2023-09-22 DIAGNOSIS — M9903 Segmental and somatic dysfunction of lumbar region: Secondary | ICD-10-CM | POA: Diagnosis not present

## 2023-10-05 ENCOUNTER — Ambulatory Visit: Payer: Self-pay | Attending: Surgery

## 2023-10-05 DIAGNOSIS — I8393 Asymptomatic varicose veins of bilateral lower extremities: Secondary | ICD-10-CM

## 2023-10-05 DIAGNOSIS — M7989 Other specified soft tissue disorders: Secondary | ICD-10-CM

## 2023-10-05 NOTE — Progress Notes (Signed)
 Pt's BLE spider and reticular veins treated with Asclera 1%. Pt received a total of 6 mL/60 mg of Asclera 1%, administered with a 27 gauge butterfly needle. Pt tolerated well; easy access. She was placed in 20-30 mm Hg thigh high compression hose at the end of treatment. She c/o RLE pain that has been ongoing for months and is unsure if it is due to her back pain or related to her veins. She has not had a reflux study on this leg in the past and we discussed possibly doing this in the near future. She was given post treatment care instructions on handout and verbally. She will call if she has any questions/concerns.

## 2023-10-18 DIAGNOSIS — L570 Actinic keratosis: Secondary | ICD-10-CM | POA: Diagnosis not present

## 2023-10-18 DIAGNOSIS — M9905 Segmental and somatic dysfunction of pelvic region: Secondary | ICD-10-CM | POA: Diagnosis not present

## 2023-10-18 DIAGNOSIS — D2261 Melanocytic nevi of right upper limb, including shoulder: Secondary | ICD-10-CM | POA: Diagnosis not present

## 2023-10-18 DIAGNOSIS — L820 Inflamed seborrheic keratosis: Secondary | ICD-10-CM | POA: Diagnosis not present

## 2023-10-18 DIAGNOSIS — M9903 Segmental and somatic dysfunction of lumbar region: Secondary | ICD-10-CM | POA: Diagnosis not present

## 2023-10-18 DIAGNOSIS — D2272 Melanocytic nevi of left lower limb, including hip: Secondary | ICD-10-CM | POA: Diagnosis not present

## 2023-10-18 DIAGNOSIS — M5136 Other intervertebral disc degeneration, lumbar region with discogenic back pain only: Secondary | ICD-10-CM | POA: Diagnosis not present

## 2023-10-18 DIAGNOSIS — D225 Melanocytic nevi of trunk: Secondary | ICD-10-CM | POA: Diagnosis not present

## 2023-10-18 DIAGNOSIS — Z85828 Personal history of other malignant neoplasm of skin: Secondary | ICD-10-CM | POA: Diagnosis not present

## 2023-10-18 DIAGNOSIS — D1801 Hemangioma of skin and subcutaneous tissue: Secondary | ICD-10-CM | POA: Diagnosis not present

## 2023-10-18 DIAGNOSIS — L821 Other seborrheic keratosis: Secondary | ICD-10-CM | POA: Diagnosis not present

## 2023-10-18 DIAGNOSIS — M9904 Segmental and somatic dysfunction of sacral region: Secondary | ICD-10-CM | POA: Diagnosis not present

## 2023-10-18 DIAGNOSIS — C44519 Basal cell carcinoma of skin of other part of trunk: Secondary | ICD-10-CM | POA: Diagnosis not present

## 2023-10-20 DIAGNOSIS — M5136 Other intervertebral disc degeneration, lumbar region with discogenic back pain only: Secondary | ICD-10-CM | POA: Diagnosis not present

## 2023-10-20 DIAGNOSIS — M9905 Segmental and somatic dysfunction of pelvic region: Secondary | ICD-10-CM | POA: Diagnosis not present

## 2023-10-20 DIAGNOSIS — M9904 Segmental and somatic dysfunction of sacral region: Secondary | ICD-10-CM | POA: Diagnosis not present

## 2023-10-20 DIAGNOSIS — M9903 Segmental and somatic dysfunction of lumbar region: Secondary | ICD-10-CM | POA: Diagnosis not present

## 2023-10-26 DIAGNOSIS — M9905 Segmental and somatic dysfunction of pelvic region: Secondary | ICD-10-CM | POA: Diagnosis not present

## 2023-10-26 DIAGNOSIS — M9904 Segmental and somatic dysfunction of sacral region: Secondary | ICD-10-CM | POA: Diagnosis not present

## 2023-10-26 DIAGNOSIS — M5136 Other intervertebral disc degeneration, lumbar region with discogenic back pain only: Secondary | ICD-10-CM | POA: Diagnosis not present

## 2023-10-26 DIAGNOSIS — M9903 Segmental and somatic dysfunction of lumbar region: Secondary | ICD-10-CM | POA: Diagnosis not present

## 2023-11-18 DIAGNOSIS — M51361 Other intervertebral disc degeneration, lumbar region with lower extremity pain only: Secondary | ICD-10-CM | POA: Diagnosis not present

## 2023-11-18 DIAGNOSIS — M9903 Segmental and somatic dysfunction of lumbar region: Secondary | ICD-10-CM | POA: Diagnosis not present

## 2023-11-18 DIAGNOSIS — M9905 Segmental and somatic dysfunction of pelvic region: Secondary | ICD-10-CM | POA: Diagnosis not present

## 2023-11-18 DIAGNOSIS — M9904 Segmental and somatic dysfunction of sacral region: Secondary | ICD-10-CM | POA: Diagnosis not present

## 2023-11-23 DIAGNOSIS — M9905 Segmental and somatic dysfunction of pelvic region: Secondary | ICD-10-CM | POA: Diagnosis not present

## 2023-11-23 DIAGNOSIS — M51361 Other intervertebral disc degeneration, lumbar region with lower extremity pain only: Secondary | ICD-10-CM | POA: Diagnosis not present

## 2023-11-23 DIAGNOSIS — M9903 Segmental and somatic dysfunction of lumbar region: Secondary | ICD-10-CM | POA: Diagnosis not present

## 2023-11-23 DIAGNOSIS — M9904 Segmental and somatic dysfunction of sacral region: Secondary | ICD-10-CM | POA: Diagnosis not present

## 2023-12-07 DIAGNOSIS — M9905 Segmental and somatic dysfunction of pelvic region: Secondary | ICD-10-CM | POA: Diagnosis not present

## 2023-12-07 DIAGNOSIS — M9904 Segmental and somatic dysfunction of sacral region: Secondary | ICD-10-CM | POA: Diagnosis not present

## 2023-12-07 DIAGNOSIS — M9903 Segmental and somatic dysfunction of lumbar region: Secondary | ICD-10-CM | POA: Diagnosis not present

## 2023-12-07 DIAGNOSIS — M51361 Other intervertebral disc degeneration, lumbar region with lower extremity pain only: Secondary | ICD-10-CM | POA: Diagnosis not present

## 2023-12-09 DIAGNOSIS — M9904 Segmental and somatic dysfunction of sacral region: Secondary | ICD-10-CM | POA: Diagnosis not present

## 2023-12-09 DIAGNOSIS — E039 Hypothyroidism, unspecified: Secondary | ICD-10-CM | POA: Diagnosis not present

## 2023-12-09 DIAGNOSIS — M9905 Segmental and somatic dysfunction of pelvic region: Secondary | ICD-10-CM | POA: Diagnosis not present

## 2023-12-09 DIAGNOSIS — M9903 Segmental and somatic dysfunction of lumbar region: Secondary | ICD-10-CM | POA: Diagnosis not present

## 2023-12-09 DIAGNOSIS — M51361 Other intervertebral disc degeneration, lumbar region with lower extremity pain only: Secondary | ICD-10-CM | POA: Diagnosis not present

## 2023-12-22 DIAGNOSIS — M9905 Segmental and somatic dysfunction of pelvic region: Secondary | ICD-10-CM | POA: Diagnosis not present

## 2023-12-22 DIAGNOSIS — M51361 Other intervertebral disc degeneration, lumbar region with lower extremity pain only: Secondary | ICD-10-CM | POA: Diagnosis not present

## 2023-12-22 DIAGNOSIS — M9904 Segmental and somatic dysfunction of sacral region: Secondary | ICD-10-CM | POA: Diagnosis not present

## 2023-12-22 DIAGNOSIS — M9903 Segmental and somatic dysfunction of lumbar region: Secondary | ICD-10-CM | POA: Diagnosis not present

## 2024-01-09 DIAGNOSIS — E039 Hypothyroidism, unspecified: Secondary | ICD-10-CM | POA: Diagnosis not present

## 2024-01-12 DIAGNOSIS — M9905 Segmental and somatic dysfunction of pelvic region: Secondary | ICD-10-CM | POA: Diagnosis not present

## 2024-01-12 DIAGNOSIS — M9903 Segmental and somatic dysfunction of lumbar region: Secondary | ICD-10-CM | POA: Diagnosis not present

## 2024-01-12 DIAGNOSIS — M51361 Other intervertebral disc degeneration, lumbar region with lower extremity pain only: Secondary | ICD-10-CM | POA: Diagnosis not present

## 2024-01-12 DIAGNOSIS — M9904 Segmental and somatic dysfunction of sacral region: Secondary | ICD-10-CM | POA: Diagnosis not present

## 2024-01-13 DIAGNOSIS — Z1211 Encounter for screening for malignant neoplasm of colon: Secondary | ICD-10-CM | POA: Diagnosis not present

## 2024-01-18 DIAGNOSIS — M9904 Segmental and somatic dysfunction of sacral region: Secondary | ICD-10-CM | POA: Diagnosis not present

## 2024-01-18 DIAGNOSIS — M9905 Segmental and somatic dysfunction of pelvic region: Secondary | ICD-10-CM | POA: Diagnosis not present

## 2024-01-18 DIAGNOSIS — M51361 Other intervertebral disc degeneration, lumbar region with lower extremity pain only: Secondary | ICD-10-CM | POA: Diagnosis not present

## 2024-01-18 DIAGNOSIS — M9903 Segmental and somatic dysfunction of lumbar region: Secondary | ICD-10-CM | POA: Diagnosis not present

## 2024-02-02 DIAGNOSIS — F419 Anxiety disorder, unspecified: Secondary | ICD-10-CM | POA: Diagnosis not present

## 2024-02-02 DIAGNOSIS — M2550 Pain in unspecified joint: Secondary | ICD-10-CM | POA: Diagnosis not present

## 2024-02-02 DIAGNOSIS — Z79899 Other long term (current) drug therapy: Secondary | ICD-10-CM | POA: Diagnosis not present

## 2024-02-02 DIAGNOSIS — M5136 Other intervertebral disc degeneration, lumbar region with discogenic back pain only: Secondary | ICD-10-CM | POA: Diagnosis not present

## 2024-02-03 DIAGNOSIS — M2550 Pain in unspecified joint: Secondary | ICD-10-CM | POA: Diagnosis not present

## 2024-02-03 DIAGNOSIS — M160 Bilateral primary osteoarthritis of hip: Secondary | ICD-10-CM | POA: Diagnosis not present

## 2024-02-07 DIAGNOSIS — M1812 Unilateral primary osteoarthritis of first carpometacarpal joint, left hand: Secondary | ICD-10-CM | POA: Diagnosis not present

## 2024-02-07 DIAGNOSIS — M2559 Pain in other specified joint: Secondary | ICD-10-CM | POA: Diagnosis not present

## 2024-02-07 DIAGNOSIS — M151 Heberden's nodes (with arthropathy): Secondary | ICD-10-CM | POA: Diagnosis not present

## 2024-02-08 DIAGNOSIS — E039 Hypothyroidism, unspecified: Secondary | ICD-10-CM | POA: Diagnosis not present

## 2024-02-09 DIAGNOSIS — W19XXXA Unspecified fall, initial encounter: Secondary | ICD-10-CM | POA: Diagnosis not present

## 2024-02-09 DIAGNOSIS — S0083XA Contusion of other part of head, initial encounter: Secondary | ICD-10-CM | POA: Diagnosis not present

## 2024-02-10 DIAGNOSIS — M9904 Segmental and somatic dysfunction of sacral region: Secondary | ICD-10-CM | POA: Diagnosis not present

## 2024-02-10 DIAGNOSIS — M9903 Segmental and somatic dysfunction of lumbar region: Secondary | ICD-10-CM | POA: Diagnosis not present

## 2024-02-10 DIAGNOSIS — M5136 Other intervertebral disc degeneration, lumbar region with discogenic back pain only: Secondary | ICD-10-CM | POA: Diagnosis not present

## 2024-02-10 DIAGNOSIS — M9905 Segmental and somatic dysfunction of pelvic region: Secondary | ICD-10-CM | POA: Diagnosis not present

## 2024-02-11 ENCOUNTER — Other Ambulatory Visit: Payer: Self-pay

## 2024-02-11 ENCOUNTER — Emergency Department (HOSPITAL_BASED_OUTPATIENT_CLINIC_OR_DEPARTMENT_OTHER)

## 2024-02-11 ENCOUNTER — Emergency Department (HOSPITAL_BASED_OUTPATIENT_CLINIC_OR_DEPARTMENT_OTHER)
Admission: EM | Admit: 2024-02-11 | Discharge: 2024-02-11 | Disposition: A | Discharge status: Home / Self Care | Source: Ambulatory Visit | Attending: Emergency Medicine | Admitting: Emergency Medicine

## 2024-02-11 DIAGNOSIS — Z79899 Other long term (current) drug therapy: Secondary | ICD-10-CM | POA: Insufficient documentation

## 2024-02-11 DIAGNOSIS — M5030 Other cervical disc degeneration, unspecified cervical region: Secondary | ICD-10-CM | POA: Diagnosis not present

## 2024-02-11 DIAGNOSIS — W01198D Fall on same level from slipping, tripping and stumbling with subsequent striking against other object, subsequent encounter: Secondary | ICD-10-CM | POA: Diagnosis not present

## 2024-02-11 DIAGNOSIS — E039 Hypothyroidism, unspecified: Secondary | ICD-10-CM | POA: Diagnosis not present

## 2024-02-11 DIAGNOSIS — W19XXXD Unspecified fall, subsequent encounter: Secondary | ICD-10-CM

## 2024-02-11 DIAGNOSIS — M79604 Pain in right leg: Secondary | ICD-10-CM | POA: Insufficient documentation

## 2024-02-11 DIAGNOSIS — J342 Deviated nasal septum: Secondary | ICD-10-CM | POA: Diagnosis not present

## 2024-02-11 DIAGNOSIS — S0083XA Contusion of other part of head, initial encounter: Secondary | ICD-10-CM | POA: Diagnosis not present

## 2024-02-11 DIAGNOSIS — M79621 Pain in right upper arm: Secondary | ICD-10-CM | POA: Insufficient documentation

## 2024-02-11 DIAGNOSIS — Z043 Encounter for examination and observation following other accident: Secondary | ICD-10-CM | POA: Diagnosis not present

## 2024-02-11 DIAGNOSIS — S0993XA Unspecified injury of face, initial encounter: Secondary | ICD-10-CM | POA: Diagnosis not present

## 2024-02-11 DIAGNOSIS — R519 Headache, unspecified: Secondary | ICD-10-CM | POA: Diagnosis not present

## 2024-02-11 DIAGNOSIS — S0990XA Unspecified injury of head, initial encounter: Secondary | ICD-10-CM | POA: Diagnosis not present

## 2024-02-11 NOTE — ED Provider Notes (Signed)
 [place] EMERGENCY DEPARTMENT AT [facility] Provider Note   CSN: [id number] Arrival date & time: 02/11/24  1339     Patient presents with: No chief complaint on file.   Renee Montgomery is a 73 y.o. female with history of anxiety and hypothyroidism presents following mechanical ground-level fall.  Patient states that Tuesday night she fell when let her dogs out and hit a birdbath.  She did not lose consciousness.  She is not on blood thinners.  She has been having headaches, dizziness and nausea since.  Has pain into her right jaw and right side of her neck.  No vomiting.  No extremity weakness or numbness.  She complains of some pain in her right upper extremity and right lower extremity where she hit the ground as well.  Patient had been evaluated at [facility], was felt to be consistent with a concussion.   HPI    Past Medical History:  Diagnosis Date   Anxiety    Thyroid  disease    Hypo   Past Surgical History:  Procedure Laterality Date   AUGMENTATION MAMMAPLASTY Bilateral    BREAST ENHANCEMENT SURGERY  2003   FOREARM FRACTURE SURGERY  1999   left   UMBILICAL HERNIA REPAIR  1990     Prior to Admission medications   Medication Sig Start Date End Date Taking? Authorizing Provider  ALPRAZolam  (XANAX ) 0.25 MG tablet Take 0.25 mg by mouth at bedtime as needed. Patient not taking: Reported on 03/04/2021    [provider]  ALPRAZolam  (XANAX ) 0.25 MG tablet Take 1 tablet by mouth once a day if needed for anxiety/panic (not a daily medication, avoid regular use) 06/13/21     diazepam  (VALIUM ) 5 MG tablet Take 1 tablet by mouth 30 - 45 minutes prior to procedure 07/10/21     estradiol  (ESTRACE ) 0.1 MG/GM vaginal cream Place 1 g vaginally 2 (two) times a week.    [provider]  estradiol  (ESTRACE ) 1 MG tablet Take 1 mg by mouth daily.    [provider]  estradiol -norethindrone  (ACTIVELLA ) 1-0.5 MG tablet TAKE 1 TABLET BY MOUTH ONCE DAILY  03/21/20 03/21/21  Lilton Legions, DO  estradiol -norethindrone  (MIMVEY ) 1-0.5 MG tablet TAKE 1 TABLET BY MOUTH ONCE DAILY 03/27/21     fish oil-omega-3 fatty acids 1000 MG capsule Take 1 g by mouth daily.    [provider]  levothyroxine  (SYNTHROID ) 100 MCG tablet TAKE 1 TABLET BY MOUTH EVERY MORNING ON AN EMPTY STOMACH 09/13/19 09/12/20  Koirala, Dibas, MD  levothyroxine  (SYNTHROID ) 100 MCG tablet TAKE 1 TABLET BY MOUTH EVERY MORNING ON AN EMPTY STOMACH 09/12/21     levothyroxine  (SYNTHROID , LEVOTHROID) 100 MCG tablet Take 100 mcg by mouth daily.    [provider]  Multiple Vitamins-Minerals (MULTIVITAMIN WITH MINERALS) tablet Take 1 tablet by mouth daily.    [provider]  traMADol (ULTRAM) 50 MG tablet Take by mouth every 6 (six) hours as needed for severe pain.    [provider]  zolpidem  (AMBIEN ) 10 MG tablet Take 5 mg by mouth at bedtime as needed.    [provider]  zolpidem  (AMBIEN ) 10 MG tablet TAKE 1 TABLET BY MOUTH EVERY NIGHT AT BEDTIME AS NEEDED FOR INSOMNIA ( AVOID REGULAR USE, NOT A DAILY MEDICATION) 03/22/20 09/18/20  Koirala, Dibas, MD  zolpidem  (AMBIEN ) 10 MG tablet Take 1 tablet by mouth at bedtime as needed for insomnia (avoid regular use, not a daily medication) 06/13/21  Allergies: Patient has no known allergies.    Review of Systems  Neurological:  Positive for headaches.    Updated Vital Signs BP 126/78 (BP Location: Left Arm)   Pulse 77   Temp 98.1 F (36.7 C)   Resp 18   SpO2 100%   Physical Exam Vitals and nursing note reviewed.  Constitutional:      General: She is not in acute distress.    Appearance: She is well-developed.  HENT:     Head:     Comments: Mild ecchymosis over right jaw.  Has associated tenderness to this area as well as inferior orbital region.    Right Ear: Tympanic membrane normal.     Left Ear: Tympanic membrane normal.  Eyes:     Conjunctiva/sclera: Conjunctivae normal.   Cardiovascular:     Rate and Rhythm: Normal rate and regular rhythm.     Heart sounds: No murmur heard. Pulmonary:     Effort: Pulmonary effort is normal. No respiratory distress.     Breath sounds: Normal breath sounds. No stridor.  Abdominal:     Palpations: Abdomen is soft.     Tenderness: There is no abdominal tenderness.  Musculoskeletal:        General: No swelling.     Cervical back: Neck supple.  Skin:    General: Skin is warm and dry.     Capillary Refill: Capillary refill takes less than 2 seconds.  Neurological:     Mental Status: She is alert.     Comments: Patient is alert and oriented. There is no abnormal phonation. Symmetric smile without facial droop. Moves all extremities spontaneously. 5/5 strength in upper and lower extremities. . No sensation deficit. There is no nystagmus. EOMI, PERRL. Coordination intact with finger to nose and normal ambulation.    Psychiatric:        Mood and Affect: Mood normal.     (all labs ordered are listed, but only abnormal results are displayed) Labs Reviewed - No data to display  EKG: None  Radiology: CT Maxillofacial Wo Contrast Result Date: 02/11/2024 CLINICAL DATA:  Blunt facial trauma.  Fall. EXAM: CT MAXILLOFACIAL WITHOUT CONTRAST TECHNIQUE: Multidetector CT imaging of the maxillofacial structures was performed. Multiplanar CT image reconstructions were also generated. RADIATION DOSE REDUCTION: This exam was performed according to the departmental dose-optimization program which includes automated exposure control, adjustment of the mA and/or kV according to patient size and/or use of iterative reconstruction technique. COMPARISON:  None Available. FINDINGS: Osseous: No acute fracture of the nasal bone, zygomatic arches or mandibles. Temporomandibular joints are congruent. Minimal broad-based leftward nasal septal deviation. Orbits: No acute orbital fracture.  No evidence of globe injury. Sinuses: No sinus fracture or  hemosinus. Paranasal sinuses are clear. No mastoid effusion. Soft tissues: No confluent hematoma. Limited intracranial: Assessed on concurrent head CT, reported separately. IMPRESSION: No acute facial bone fracture. Electronically Signed   By: Andrea Gasman M.D.   On: 02/11/2024 14:58   CT Cervical Spine Wo Contrast Result Date: 02/11/2024 CLINICAL DATA:  Pain after fall. EXAM: CT CERVICAL SPINE WITHOUT CONTRAST TECHNIQUE: Multidetector CT imaging of the cervical spine was performed without intravenous contrast. Multiplanar CT image reconstructions were also generated. RADIATION DOSE REDUCTION: This exam was performed according to the departmental dose-optimization program which includes automated exposure control, adjustment of the mA and/or kV according to patient size and/or use of iterative reconstruction technique. COMPARISON:  None Available. FINDINGS: Alignment: Exaggerated cervical lordosis.  No traumatic subluxation. Skull base and  vertebrae: No acute fracture. Vertebral body heights are maintained. The dens and skull base are intact. Soft tissues and spinal canal: No prevertebral fluid or swelling. No visible canal hematoma. Disc levels: Multilevel facet hypertrophy. Minor degenerative disc disease for age. Upper chest: No acute findings. Other: None. IMPRESSION: 1. No acute fracture or traumatic subluxation of the cervical spine. 2. Multilevel facet hypertrophy. Electronically Signed   By: Andrea Gasman M.D.   On: 02/11/2024 14:55   CT Head Wo Contrast Result Date: 02/11/2024 CLINICAL DATA:  Pain after fall. EXAM: CT HEAD WITHOUT CONTRAST TECHNIQUE: Contiguous axial images were obtained from the base of the skull through the vertex without intravenous contrast. RADIATION DOSE REDUCTION: This exam was performed according to the departmental dose-optimization program which includes automated exposure control, adjustment of the mA and/or kV according to patient size and/or use of iterative  reconstruction technique. COMPARISON:  None Available. FINDINGS: Brain: No intracranial hemorrhage, mass effect, or midline shift. No hydrocephalus. The basilar cisterns are patent. No evidence of territorial infarct or acute ischemia. No extra-axial or intracranial fluid collection. Vascular: No hyperdense vessel or unexpected calcification. Skull: No fracture or focal lesion. Sinuses/Orbits: Assessed on concurrent face CT, reported separately. Other: No confluent scalp hematoma. IMPRESSION: No acute intracranial abnormality. No skull fracture. Electronically Signed   By: Andrea Gasman M.D.   On: 02/11/2024 14:52     Procedures   Medications Ordered in the ED - No data to display  Clinical Course as of 02/11/24 1525  Fri Feb 11, 2024  1521 Patient evaluated following ground-level mechanical fall on Tuesday night with complaints of headaches with associated dizziness and nausea as well as pain to her jaw, right upper extremity and right lower extremity.  She is hemodynamically stable.  She has no neurodeficits on exam.  She does have some ecchymosis to her right humerus and right anterior tibia with mild associated tenderness.  Tolerates full range of motion of both of these extremities.  She does additionally have some tenderness to the right jaw and supraorbital region with some associated ecchymosis as well  CT imaging is overall without any evidence of acute abnormality.  Offered x-ray imaging of her extremities.  She declines.  Overall etiology is most consistent with contusions and concussion.  Concussion protocol provided.  Strict return precautions provided as well.  Will provide a neurology follow-up.  Patient is understanding agreement with plan. [JT]    Clinical Course User Index [JT] Donnajean Lynwood DEL, PA-C                                 Medical Decision Making Amount and/or Complexity of Data Reviewed Radiology: ordered.   This patient presents to the ED with chief  complaint(s) of fall .  The complaint involves an extensive differential diagnosis and also carries with it a high risk of complications and morbidity.   Pertinent past medical history as listed in HPI  The differential diagnosis includes  Concussion, intracranial hemorrhage, fracture, dislocation, sprain Additional history obtained: Additional history obtained from spouse Records reviewed Care Everywhere/External Records  Disposition:   Patient will be discharged home. The patient has been appropriately medically screened and/or stabilized in the ED. I have low suspicion for any other emergent medical condition which would require further screening, evaluation or treatment in the ED or require inpatient management. At time of discharge the patient is hemodynamically stable and in no acute distress. I  have discussed work-up results and diagnosis with patient and answered all questions. Patient is agreeable with discharge plan. We discussed strict return precautions for returning to the emergency department and they verbalized understanding.     Social Determinants of Health:   none  This note was dictated with voice recognition software.  Despite best efforts at proofreading, errors may have occurred which can change the documentation meaning.       Final diagnoses:  Fall, subsequent encounter    ED Discharge Orders     None          Donnajean Lynwood DEL, PA-C 02/11/24 1618    Pamella Ozell LABOR, DO 02/17/24 1735

## 2024-02-11 NOTE — Discharge Instructions (Addendum)
 You were evaluated the emergency room following a fall.  Your imaging did not show any acute abnormality.  Your exam and history are consistent with a contusion and concussion.  Please see the attached packet for concussion protocol.  Should be expecting a call to follow-up with a neurologist for further evaluation.

## 2024-02-11 NOTE — ED Triage Notes (Signed)
 Fall on Tuesday night. Hit face on bird bath. Bruising to face. C/o Denies LOC. Denies thinners.   Reports R elbow, R hip, and lower R leg pain.

## 2024-02-22 DIAGNOSIS — M5136 Other intervertebral disc degeneration, lumbar region with discogenic back pain only: Secondary | ICD-10-CM | POA: Diagnosis not present

## 2024-02-22 DIAGNOSIS — M9903 Segmental and somatic dysfunction of lumbar region: Secondary | ICD-10-CM | POA: Diagnosis not present

## 2024-02-22 DIAGNOSIS — M9905 Segmental and somatic dysfunction of pelvic region: Secondary | ICD-10-CM | POA: Diagnosis not present

## 2024-02-22 DIAGNOSIS — M9904 Segmental and somatic dysfunction of sacral region: Secondary | ICD-10-CM | POA: Diagnosis not present

## 2024-03-09 DIAGNOSIS — M9903 Segmental and somatic dysfunction of lumbar region: Secondary | ICD-10-CM | POA: Diagnosis not present

## 2024-03-09 DIAGNOSIS — M9905 Segmental and somatic dysfunction of pelvic region: Secondary | ICD-10-CM | POA: Diagnosis not present

## 2024-03-09 DIAGNOSIS — M5136 Other intervertebral disc degeneration, lumbar region with discogenic back pain only: Secondary | ICD-10-CM | POA: Diagnosis not present

## 2024-03-09 DIAGNOSIS — M9904 Segmental and somatic dysfunction of sacral region: Secondary | ICD-10-CM | POA: Diagnosis not present

## 2024-03-10 DIAGNOSIS — E039 Hypothyroidism, unspecified: Secondary | ICD-10-CM | POA: Diagnosis not present

## 2024-03-29 ENCOUNTER — Other Ambulatory Visit: Payer: Self-pay | Admitting: Obstetrics and Gynecology

## 2024-03-29 DIAGNOSIS — Z1231 Encounter for screening mammogram for malignant neoplasm of breast: Secondary | ICD-10-CM

## 2024-04-03 DIAGNOSIS — Z01419 Encounter for gynecological examination (general) (routine) without abnormal findings: Secondary | ICD-10-CM | POA: Diagnosis not present

## 2024-04-04 DIAGNOSIS — M5136 Other intervertebral disc degeneration, lumbar region with discogenic back pain only: Secondary | ICD-10-CM | POA: Diagnosis not present

## 2024-04-04 DIAGNOSIS — M9905 Segmental and somatic dysfunction of pelvic region: Secondary | ICD-10-CM | POA: Diagnosis not present

## 2024-04-04 DIAGNOSIS — M9903 Segmental and somatic dysfunction of lumbar region: Secondary | ICD-10-CM | POA: Diagnosis not present

## 2024-04-04 DIAGNOSIS — M9904 Segmental and somatic dysfunction of sacral region: Secondary | ICD-10-CM | POA: Diagnosis not present

## 2024-04-09 DIAGNOSIS — E039 Hypothyroidism, unspecified: Secondary | ICD-10-CM | POA: Diagnosis not present

## 2024-04-18 DIAGNOSIS — M9903 Segmental and somatic dysfunction of lumbar region: Secondary | ICD-10-CM | POA: Diagnosis not present

## 2024-04-18 DIAGNOSIS — M9904 Segmental and somatic dysfunction of sacral region: Secondary | ICD-10-CM | POA: Diagnosis not present

## 2024-04-18 DIAGNOSIS — M9905 Segmental and somatic dysfunction of pelvic region: Secondary | ICD-10-CM | POA: Diagnosis not present

## 2024-04-18 DIAGNOSIS — M5136 Other intervertebral disc degeneration, lumbar region with discogenic back pain only: Secondary | ICD-10-CM | POA: Diagnosis not present

## 2024-04-27 ENCOUNTER — Inpatient Hospital Stay
Admission: RE | Admit: 2024-04-27 | Discharge: 2024-04-27 | Attending: Obstetrics and Gynecology | Admitting: Obstetrics and Gynecology

## 2024-04-27 DIAGNOSIS — Z1231 Encounter for screening mammogram for malignant neoplasm of breast: Secondary | ICD-10-CM
